# Patient Record
Sex: Male | Born: 1960 | Race: White | Hispanic: No | Marital: Married | Smoking: Former smoker
Health system: Southern US, Community
[De-identification: ages and names within clinical notes are randomized; demographics above are authoritative.]

## PROBLEM LIST (undated history)

## (undated) DIAGNOSIS — F411 Generalized anxiety disorder: Secondary | ICD-10-CM

## (undated) DIAGNOSIS — T7840XA Allergy, unspecified, initial encounter: Secondary | ICD-10-CM

## (undated) DIAGNOSIS — E785 Hyperlipidemia, unspecified: Secondary | ICD-10-CM

## (undated) DIAGNOSIS — J309 Allergic rhinitis, unspecified: Secondary | ICD-10-CM

## (undated) DIAGNOSIS — K219 Gastro-esophageal reflux disease without esophagitis: Secondary | ICD-10-CM

## (undated) DIAGNOSIS — G4730 Sleep apnea, unspecified: Secondary | ICD-10-CM

## (undated) DIAGNOSIS — F329 Major depressive disorder, single episode, unspecified: Secondary | ICD-10-CM

## (undated) HISTORY — DX: Gastro-esophageal reflux disease without esophagitis: K21.9

## (undated) HISTORY — PX: SHOULDER SURGERY: SHX246

## (undated) HISTORY — DX: Major depressive disorder, single episode, unspecified: F32.9

## (undated) HISTORY — DX: Hyperlipidemia, unspecified: E78.5

## (undated) HISTORY — DX: Sleep apnea, unspecified: G47.30

## (undated) HISTORY — DX: Generalized anxiety disorder: F41.1

## (undated) HISTORY — DX: Allergy, unspecified, initial encounter: T78.40XA

## (undated) HISTORY — DX: Allergic rhinitis, unspecified: J30.9

---

## 1986-12-20 HISTORY — PX: ORIF ELBOW FRACTURE: SHX5031

## 1997-12-20 HISTORY — PX: UPPER GASTROINTESTINAL ENDOSCOPY: SHX188

## 2004-11-06 ENCOUNTER — Emergency Department (HOSPITAL_COMMUNITY): Admission: EM | Admit: 2004-11-06 | Discharge: 2004-11-06 | Payer: Self-pay | Admitting: Family Medicine

## 2007-05-10 ENCOUNTER — Emergency Department (HOSPITAL_COMMUNITY): Admission: EM | Admit: 2007-05-10 | Discharge: 2007-05-10 | Payer: Self-pay | Admitting: Emergency Medicine

## 2009-05-24 ENCOUNTER — Emergency Department (HOSPITAL_COMMUNITY): Admission: EM | Admit: 2009-05-24 | Discharge: 2009-05-24 | Payer: Self-pay | Admitting: Emergency Medicine

## 2009-06-03 ENCOUNTER — Encounter: Payer: Self-pay | Admitting: Family Medicine

## 2009-06-04 ENCOUNTER — Ambulatory Visit: Payer: Self-pay | Admitting: Family Medicine

## 2009-06-04 DIAGNOSIS — F411 Generalized anxiety disorder: Secondary | ICD-10-CM

## 2009-06-04 DIAGNOSIS — R030 Elevated blood-pressure reading, without diagnosis of hypertension: Secondary | ICD-10-CM | POA: Insufficient documentation

## 2009-06-04 DIAGNOSIS — J309 Allergic rhinitis, unspecified: Secondary | ICD-10-CM | POA: Insufficient documentation

## 2009-06-04 DIAGNOSIS — F329 Major depressive disorder, single episode, unspecified: Secondary | ICD-10-CM

## 2009-06-04 DIAGNOSIS — K219 Gastro-esophageal reflux disease without esophagitis: Secondary | ICD-10-CM

## 2009-06-04 DIAGNOSIS — F3289 Other specified depressive episodes: Secondary | ICD-10-CM

## 2009-06-04 DIAGNOSIS — R42 Dizziness and giddiness: Secondary | ICD-10-CM | POA: Insufficient documentation

## 2009-06-04 DIAGNOSIS — A630 Anogenital (venereal) warts: Secondary | ICD-10-CM | POA: Insufficient documentation

## 2009-06-04 HISTORY — DX: Allergic rhinitis, unspecified: J30.9

## 2009-06-04 HISTORY — DX: Generalized anxiety disorder: F41.1

## 2009-06-04 HISTORY — DX: Other specified depressive episodes: F32.89

## 2009-06-04 HISTORY — DX: Gastro-esophageal reflux disease without esophagitis: K21.9

## 2009-06-04 HISTORY — DX: Major depressive disorder, single episode, unspecified: F32.9

## 2009-07-01 ENCOUNTER — Ambulatory Visit: Payer: Self-pay | Admitting: Family Medicine

## 2009-07-01 DIAGNOSIS — R5383 Other fatigue: Secondary | ICD-10-CM

## 2009-07-01 DIAGNOSIS — R5381 Other malaise: Secondary | ICD-10-CM | POA: Insufficient documentation

## 2009-07-01 LAB — CONVERTED CEMR LAB
ALT: 28 units/L (ref 0–53)
AST: 23 units/L (ref 0–37)
Albumin: 4.7 g/dL (ref 3.5–5.2)
Alkaline Phosphatase: 37 units/L — ABNORMAL LOW (ref 39–117)
BUN: 13 mg/dL (ref 6–23)
Basophils Absolute: 0 10*3/uL (ref 0.0–0.1)
Basophils Relative: 0.2 % (ref 0.0–3.0)
Bilirubin, Direct: 0.2 mg/dL (ref 0.0–0.3)
CO2: 29 meq/L (ref 19–32)
Calcium: 9.4 mg/dL (ref 8.4–10.5)
Chloride: 105 meq/L (ref 96–112)
Cholesterol: 252 mg/dL — ABNORMAL HIGH (ref 0–200)
Creatinine, Ser: 0.9 mg/dL (ref 0.4–1.5)
Direct LDL: 189.3 mg/dL
Eosinophils Absolute: 0.1 10*3/uL (ref 0.0–0.7)
Eosinophils Relative: 1.1 % (ref 0.0–5.0)
GFR calc non Af Amer: 96.03 mL/min (ref >60)
Glucose, Bld: 103 mg/dL — ABNORMAL HIGH (ref 70–99)
HCT: 43.8 % (ref 39.0–52.0)
HDL: 46.7 mg/dL (ref >39.00)
Hemoglobin: 15 g/dL (ref 13.0–17.0)
Lymphocytes Relative: 31.5 % (ref 12.0–46.0)
Lymphs Abs: 2.1 10*3/uL (ref 0.7–4.0)
MCHC: 34.2 g/dL (ref 30.0–36.0)
MCV: 91.2 fL (ref 78.0–100.0)
Monocytes Absolute: 0.4 10*3/uL (ref 0.1–1.0)
Monocytes Relative: 5.6 % (ref 3.0–12.0)
Neutro Abs: 4.2 10*3/uL (ref 1.4–7.7)
Neutrophils Relative %: 61.6 % (ref 43.0–77.0)
PSA: 0.69 ng/mL (ref 0.10–4.00)
Platelets: 223 10*3/uL (ref 150.0–400.0)
Potassium: 4.5 meq/L (ref 3.5–5.1)
RBC: 4.8 M/uL (ref 4.22–5.81)
RDW: 11.7 % (ref 11.5–14.6)
Sodium: 140 meq/L (ref 135–145)
TSH: 1.49 microintl units/mL (ref 0.35–5.50)
Total Bilirubin: 0.9 mg/dL (ref 0.3–1.2)
Total CHOL/HDL Ratio: 5
Total Protein: 7.6 g/dL (ref 6.0–8.3)
Triglycerides: 120 mg/dL (ref 0.0–149.0)
VLDL: 24 mg/dL (ref 0.0–40.0)
WBC: 6.8 10*3/uL (ref 4.5–10.5)

## 2009-07-02 ENCOUNTER — Telehealth: Payer: Self-pay | Admitting: Family Medicine

## 2009-07-07 ENCOUNTER — Ambulatory Visit: Payer: Self-pay | Admitting: Family Medicine

## 2009-07-07 DIAGNOSIS — E785 Hyperlipidemia, unspecified: Secondary | ICD-10-CM

## 2009-07-07 HISTORY — DX: Hyperlipidemia, unspecified: E78.5

## 2009-07-10 LAB — CONVERTED CEMR LAB
HCV Ab: NEGATIVE
Hep A IgM: NEGATIVE
Hep B C IgM: NEGATIVE
Hepatitis B Surface Ag: NEGATIVE

## 2009-07-15 ENCOUNTER — Ambulatory Visit: Payer: Self-pay | Admitting: Licensed Clinical Social Worker

## 2009-07-23 ENCOUNTER — Ambulatory Visit: Payer: Self-pay | Admitting: Licensed Clinical Social Worker

## 2009-08-07 ENCOUNTER — Ambulatory Visit: Payer: Self-pay | Admitting: Family Medicine

## 2009-08-07 DIAGNOSIS — M67919 Unspecified disorder of synovium and tendon, unspecified shoulder: Secondary | ICD-10-CM | POA: Insufficient documentation

## 2009-08-07 DIAGNOSIS — M7520 Bicipital tendinitis, unspecified shoulder: Secondary | ICD-10-CM | POA: Insufficient documentation

## 2009-08-07 DIAGNOSIS — M719 Bursopathy, unspecified: Secondary | ICD-10-CM

## 2009-08-07 DIAGNOSIS — M25519 Pain in unspecified shoulder: Secondary | ICD-10-CM | POA: Insufficient documentation

## 2009-08-08 ENCOUNTER — Ambulatory Visit: Payer: Self-pay | Admitting: Licensed Clinical Social Worker

## 2009-08-22 ENCOUNTER — Ambulatory Visit: Payer: Self-pay | Admitting: Licensed Clinical Social Worker

## 2009-09-05 ENCOUNTER — Ambulatory Visit: Payer: Self-pay | Admitting: Licensed Clinical Social Worker

## 2009-09-16 ENCOUNTER — Ambulatory Visit: Payer: Self-pay | Admitting: Family Medicine

## 2009-09-23 ENCOUNTER — Ambulatory Visit: Payer: Self-pay | Admitting: Licensed Clinical Social Worker

## 2009-11-03 ENCOUNTER — Ambulatory Visit: Payer: Self-pay | Admitting: Family Medicine

## 2009-11-03 LAB — CONVERTED CEMR LAB
ALT: 25 units/L (ref 0–53)
AST: 18 units/L (ref 0–37)
Albumin: 4.8 g/dL (ref 3.5–5.2)
Alkaline Phosphatase: 36 units/L — ABNORMAL LOW (ref 39–117)
Bilirubin, Direct: 0.1 mg/dL (ref 0.0–0.3)
Cholesterol: 164 mg/dL (ref 0–200)
HDL: 40.8 mg/dL (ref >39.00)
LDL Cholesterol: 105 mg/dL — ABNORMAL HIGH (ref 0–99)
Total Bilirubin: 0.8 mg/dL (ref 0.3–1.2)
Total CHOL/HDL Ratio: 4
Total Protein: 7.5 g/dL (ref 6.0–8.3)
Triglycerides: 90 mg/dL (ref 0.0–149.0)
VLDL: 18 mg/dL (ref 0.0–40.0)

## 2009-11-10 ENCOUNTER — Telehealth: Payer: Self-pay | Admitting: Family Medicine

## 2010-01-14 ENCOUNTER — Telehealth: Payer: Self-pay | Admitting: Family Medicine

## 2010-04-06 ENCOUNTER — Telehealth: Payer: Self-pay | Admitting: Family Medicine

## 2010-06-26 ENCOUNTER — Telehealth: Payer: Self-pay | Admitting: Family Medicine

## 2010-07-06 ENCOUNTER — Ambulatory Visit: Payer: Self-pay | Admitting: Family Medicine

## 2010-07-06 DIAGNOSIS — J330 Polyp of nasal cavity: Secondary | ICD-10-CM | POA: Insufficient documentation

## 2010-07-06 DIAGNOSIS — L989 Disorder of the skin and subcutaneous tissue, unspecified: Secondary | ICD-10-CM | POA: Insufficient documentation

## 2010-07-07 LAB — CONVERTED CEMR LAB
ALT: 32 units/L (ref 0–53)
AST: 21 units/L (ref 0–37)
Albumin: 4.9 g/dL (ref 3.5–5.2)
Alkaline Phosphatase: 43 units/L (ref 39–117)
BUN: 15 mg/dL (ref 6–23)
Basophils Absolute: 0 10*3/uL (ref 0.0–0.1)
Basophils Relative: 0.2 % (ref 0.0–3.0)
Bilirubin, Direct: 0.1 mg/dL (ref 0.0–0.3)
CO2: 32 meq/L (ref 19–32)
Calcium: 9.5 mg/dL (ref 8.4–10.5)
Chloride: 108 meq/L (ref 96–112)
Creatinine, Ser: 1 mg/dL (ref 0.4–1.5)
Eosinophils Absolute: 0.2 10*3/uL (ref 0.0–0.7)
Eosinophils Relative: 2 % (ref 0.0–5.0)
GFR calc non Af Amer: 89.84 mL/min (ref >60)
Glucose, Bld: 91 mg/dL (ref 70–99)
HCT: 42.2 % (ref 39.0–52.0)
Hemoglobin: 14.6 g/dL (ref 13.0–17.0)
Lymphocytes Relative: 33.1 % (ref 12.0–46.0)
Lymphs Abs: 2.6 10*3/uL (ref 0.7–4.0)
MCHC: 34.7 g/dL (ref 30.0–36.0)
MCV: 90.8 fL (ref 78.0–100.0)
Monocytes Absolute: 0.5 10*3/uL (ref 0.1–1.0)
Monocytes Relative: 5.9 % (ref 3.0–12.0)
Neutro Abs: 4.7 10*3/uL (ref 1.4–7.7)
Neutrophils Relative %: 58.8 % (ref 43.0–77.0)
Platelets: 238 10*3/uL (ref 150.0–400.0)
Potassium: 4.9 meq/L (ref 3.5–5.1)
RBC: 4.64 M/uL (ref 4.22–5.81)
RDW: 13 % (ref 11.5–14.6)
Sex Hormone Binding: 15 nmol/L (ref 13–71)
Sodium: 142 meq/L (ref 135–145)
TSH: 1.72 microintl units/mL (ref 0.35–5.50)
Testosterone Free: 48.3 pg/mL (ref 47.0–244.0)
Testosterone-% Free: 2.8 % (ref 1.6–2.9)
Testosterone: 175.5 ng/dL — ABNORMAL LOW (ref 350–890)
Total Bilirubin: 0.5 mg/dL (ref 0.3–1.2)
Total Protein: 7.8 g/dL (ref 6.0–8.3)
WBC: 8 10*3/uL (ref 4.5–10.5)

## 2010-07-15 ENCOUNTER — Ambulatory Visit: Payer: Self-pay | Admitting: Family Medicine

## 2010-07-24 ENCOUNTER — Ambulatory Visit: Payer: Self-pay | Admitting: Otolaryngology

## 2010-07-27 ENCOUNTER — Encounter: Payer: Self-pay | Admitting: Family Medicine

## 2010-07-28 ENCOUNTER — Encounter: Payer: Self-pay | Admitting: Family Medicine

## 2011-01-19 NOTE — Progress Notes (Signed)
Summary: regarding labs  Phone Note Call from Patient   Caller: [phone] Call For: [name] MD Summary of Call: Pt's wife wants your opinion on whether pt can be shown to be bipolar based on labs, she said bipolar can mimic other problems in a persons lab results.  Please advise. Initial call taken by: Lowella Petties CMA,  July 02, 2009 2:12 PM  Follow-up for Phone Call        No.  Absolutely not. Mental health problems - and bipolar illness - are diagnosed clinically. No lab test can diagnose this.  Other than some high cholesterol, his labs are essentially OK. Follow-up by: [name] MD,  July 02, 2009 2:19 PM  Additional Follow-up for Phone Call Additional follow up Details #1::        Advised pt's wife. Additional Follow-up by: Lowella Petties CMA,  July 02, 2009 2:28 PM

## 2011-01-19 NOTE — Progress Notes (Signed)
Summary: impotence  Phone Note Call from Patient Call back at [phone]   Caller: Spouse Call For: [name] MD Summary of Call: Wife calling for patient patient wants to know if the is anything you can change with medications so that he will be able to get more sexually aroused.please advise Initial call taken by: Benny Lennert CMA Duncan Dull),  January 14, 2010 2:03 PM  Follow-up for Phone Call        Not seen since 08/2009  needs face to face office visit to discuss this  Follow-up by: [name] MD,  January 14, 2010 2:10 PM  Additional Follow-up for Phone Call Additional follow up Details #1::        Patients wife advised.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 They could work that out. Patient never knows when he will be home to schedule appt. Additional Follow-up by: Benny Lennert CMA Duncan Dull),  January 14, 2010 2:20 PM    Additional Follow-up for Phone Call Additional follow up Details #2::    This is an exceedingly complex question, and this needs to be addressed to the patient, not his wife. Many things can be causing sexual dysfunction, and a phone call can never fully address these isssues.  I do not think it is medically appropriate to change his psychiatric medications over the phone. I will need to see him and fully evaluate him. This is non-emergent and can be done when he is in town.   Follow-up by: [name] MD,  January 14, 2010 5:30 PM  Additional Follow-up for Phone Call Additional follow up Details #3:: Details for Additional Follow-up Action Taken: patient advised.Consuello Masse CMA  Additional Follow-up by: Benny Lennert CMA Duncan Dull),  January 19, 2010 7:56 AM

## 2011-01-19 NOTE — Progress Notes (Signed)
Summary: refill request for ibuprofen  Phone Note Refill Request Message from:  Fax from Pharmacy  Refills Requested: Medication #1:  IBUPROFEN 800 MG TABS 1 by mouth three times a day.   Last Refilled: 04/03/2010 Faxed request from [facility] [address].  Initial call taken by: Lowella Petties CMA,  June 26, 2010 9:50 AM  Follow-up for Phone Call        Await copland's return...this is a lot of ibuprofen and pt  has GERd history. unsure if Copland wishes him to continue longterm.  Follow-up by: Kerby Nora MD,  June 26, 2010 10:07 AM  Additional Follow-up for Phone Call Additional follow up Details #1::        agree. I would not want this patient to be taking ibuprofen that routinely, particularly in light of prior issues with stomach.  refill denied. chronic NSAIDS not in his best interests.  rare use ok, tylenol ok Additional Follow-up by: [name] MD,  June 26, 2010 1:29 PM    Additional Follow-up for Phone Call Additional follow up Details #2::    Patient advised.Consuello Masse CMA   Follow-up by: Benny Lennert CMA Duncan Dull),  June 26, 2010 2:29 PM

## 2011-01-19 NOTE — Miscellaneous (Signed)
Summary: Physical Therapy Evaluation/[facility]   Physical Therapy Evaluation/[facility]   Imported By: Maryln Gottron 07/31/2010 12:24:34  _____________________________________________________________________  External Attachment:    Type:   Image     Comment:   External Document

## 2011-01-19 NOTE — Assessment & Plan Note (Signed)
Summary: F/U TO DISCUSS SHOULDERS / LFW   Vital Signs:  Patient profile:   50 year old male Height:      71 inches Weight:      232.8 pounds BMI:     32.59 Temp:     98.8 degrees F oral Pulse rate:   76 / minute Pulse rhythm:   regular BP sitting:   122 / 80  (left arm) Cuff size:   regular  Vitals Entered By: Benny Lennert CMA Duncan Dull) (July 15, 2010 8:18 AM)  History of Present Illness: Chief complaint follow up to discuss shoulders  50 year old male:  The patient noted above presents with B, L > R shoulder pain that has been ongoing for 1 year there is no history of trauma or accident. The patient denies neck pain or radicular symptoms. Denies dislocation, subluxation, separation of the shoulder. The patient does complain of pain in the overhead plane.  Medications Tried: NSAIDS Tried PT: No  Prior shoulder Injury: No Prior surgery: No Prior fracture: No  Ache constantly  can still do things but they ache all the time sleepig hurts  left is worse to the right was moving some buckets of sands, left started to   X-ray Musculoskeletal  Procedure date:  07/15/2010  Findings:      DG SHOULDER *R* - [id number]   Clinical Data: 1 year bilateral shoulder pain.   RIGHT SHOULDER - 2+ VIEW   Comparison: [facility] [place] left shoulder radiographs 07/06/2010.   Findings: Slight right AC degenerative joint disease findings seen. No other significant osseous, articular or soft tissue abnormality specifically at right glenohumeral joint noted.   IMPRESSION:   1.  Slight right AC degenerative joint disease. 2.  Otherwise, negative.   Read By:  Barnie Del,  M.D.     Released By:  Barnie Del,  M.D.  X-ray  Procedure date:  07/15/2010  Findings:      [facility]* - [id number]   Clinical Data: Chronic shoulder pain   LEFT SHOULDER - 2+ VIEW   Comparison: None.   Findings: There are mild degenerative changes of the [facility] joint.   No fracture, dislocation, or bony lesions.  Soft tissues unremarkable.   IMPRESSION: Mild degenerative changes of the [facility] joint - no acute findings.   Read By:  Bernerd Limbo,  M.D.     Released By:  Bernerd Limbo,  M.D.  Allergies (verified): No Known Drug Allergies  Past History:  Past medical, surgical, family and social histories (including risk factors) reviewed, and no changes noted (except as noted below).  Past Medical History: Reviewed history from 07/07/2009 and no changes required. ELEVATED BP VENEREAL WARTS GERD  DEPRESSION  Anxiety R Elbow Fx. Allergic rhinitis Sleep Apnea, on CPAP History of esophageal stricture status post EGD and dilation Hyperlipidemia  Past Surgical History: Reviewed history from 06/04/2009 and no changes required. R Elbow, ORIF, distant  EGD, approximately 2000, dilation for stricture  Family History: Reviewed history from 06/04/2009 and no changes required. Family History Diabetes 1st degree relative Family History of Prostate CA 1st degree relative <50  o/w n/c  Social History: Reviewed history from 06/04/2009 and no changes required. Occupation:construction Married Former smoker Alcohol use-yes, rare Drug use-no Regular exercise-no  Review of Systems       REVIEW OF SYSTEMS  GEN: No systemic complaints, no fevers, chills, sweats, or other acute illnesses MSK: Detailed in the HPI GI: tolerating PO intake  without difficulty Neuro: No numbness, parasthesias, or tingling associated. Otherwise the pertinent positives of the ROS are noted above.    Physical Exam  General:  GEN: Well-developed,well-nourished,in no acute distress; alert,appropriate and cooperative throughout examination HEENT: Normocephalic and atraumatic without obvious abnormalities. No apparent alopecia or balding. Ears, externally no deformities PULM: Breathing comfortably in no respiratory distress EXT: No clubbing, cyanosis, or edema PSYCH:  Normally interactive. Cooperative during the interview. Pleasant. Friendly and conversant. Not anxious or depressed appearing. Normal, full affect.  Msk:  Shoulder: Inspection: No muscle wasting or winging Ecchymosis/edema: neg  AC joint, scapula, clavicle: NT Cervical spine: NT, full ROM Spurling's: neg Abduction: full, 5/5 Flexion: full, 5/5 IR, full, lift-off: 5/5 ER at neutral: full, 5/5 AC crossover: neg Neer: pos Hawkins: pos Drop Test: neg Empty Can: pos Supraspinatus insertion: mild-mod T Bicipital groove: NT Speed's: neg Yergason's: neg Sulcus sign: neg Scapular dyskinesis: none C5-T1 intact  Neuro: Sensation intact Grip 5/5    Impression & Recommendations:  Problem # 1:  ROTATOR CUFF SYNDROME (ICD-726.10) Shoulder anatomy was reviewed with the patient using and anatomical model.   Rotator cuff strengthening and scapular stabilization exercises were reviewed with the patient.  A handout was given based on a shoulder program from Dr. Graciella Freer of [facility] and the [facility].  Retraining shoulder mechanics and function was emphasized to the patient with rehab done at least 5-6 days a week.  The patient could benefit from formal PT to assist with scapular stabilization and RTC strengthening, referral made.  SubAC Injection, L Verbal consent was obtained from the patient. Risks, benefits, and alternatives were explained. Patient prepped with Betadine and Ethyl Chloride used for anesthesia. The subacromial space was injected using the posterior approach. The patient tolerated the procedure well and had decreased pain post injection. No complications. Injection: 9 cc of Lidocaine 1% and 1cc of Kenalog 40 mg. Needle: 22 gauge   Orders: Physical Therapy Referral (PT) Joint Aspirate / Injection, Large (20610) Kenalog 10mg  (4units) (J3301)  Problem # 2:  SHOULDER PAIN, BILATERAL (ICD-719.41) AC arthropathy, B  Orders: Physical Therapy Referral (PT) Joint  Aspirate / Injection, Large (13244) Kenalog 10mg  (4units) (J3301)  Complete Medication List: 1)  Nasonex 50 Mcg/act Susp (Mometasone furoate) .... 2 sprays each nostril two times a day 2)  Epipen 2-pak 0.3 Mg/0.3ml (1:1000) Devi (Epinephrine hcl (anaphylaxis)) 3)  Simvastatin 40 Mg Tabs (Simvastatin) .... Take one tablet at bedtime 4)  Citalopram Hydrobromide 40 Mg Tabs (Citalopram hydrobromide) .... Take 1 1/2  tablet by mouth daily 5)  Omeprazole 20 Mg Cpdr (Omeprazole) .... One by mouth daily 6)  Levocetirizine Dihydrochloride 5 Mg Tabs (Levocetirizine dihydrochloride) .Marland Kitchen.. 1 by mouth daily 7)  Singulair 10 Mg Tabs (Montelukast sodium) .Marland Kitchen.. 1 by mouth daily (failure flonase, allegra, zyrtec)  Patient Instructions: 1)  Referral Appointment Information 2)  Day/Date: 3)  Time: 4)  Place/MD: 5)  Address: 6)  Phone/Fax: 7)  Patient given appointment information. Information/Orders faxed/mailed.  8)  f/u 6 weeks Prescriptions: CITALOPRAM HYDROBROMIDE 40 MG TABS (CITALOPRAM HYDROBROMIDE) take 1 1/2  tablet by mouth daily  #135 x 4   Entered and Authorized by:   [name] MD   Signed by:   [name] MD on 07/15/2010   Method used:   [facility]        [facility]  [address] [id number]       [address]       [place], [place]  [address]  Ph: [phone] or [phone]       Fax: [phone]   RxID:   [id number]   Current Allergies (reviewed today): No known allergies

## 2011-01-19 NOTE — Assessment & Plan Note (Signed)
Summary: FOLLOW-UP ON MEDS AND MOLE ON LEFT SHOULDER/JRR   Vital Signs:  Patient profile:   50 year old male Weight:      231.50 pounds Temp:     98.5 degrees F oral Pulse rate:   64 / minute Pulse rhythm:   regular BP sitting:   128 / 86  (left arm) Cuff size:   large  Vitals Entered By: Janee Morn CMA (July 06, 2010 9:55 AM) CC: F/U meds   History of Present Illness: 50 year old male:  Has a mole on his left arm. is been changing over time, and he is concerned about it. He doesn't want very discolored, and another area is approximate 4 cm away this fleshy and coloration.  Still having some sinus problems - used some flonase at night. zyrtec - feels no better. has not tried claritin or Careers adviser.   Nothing has worked. very frustrated with this.  Still having some shoulder pain and issues - no different. bilaterally nature.  Also d/c his remeron, nothing this was improving or helping.  sex drive is low still always tired - he is able to keep and maintain am erection. He is only having problems with desire.  Sleeps with a CPAP -- has had it for a couple of years.  Nasal mask for CPAP. no recent titration  Nasal polyp - ENT (nose, allergies)    Allergies (verified): No Known Drug Allergies  Past History:  Past medical, surgical, family and social histories (including risk factors) reviewed, and no changes noted (except as noted below).  Past Medical History: Reviewed history from 07/07/2009 and no changes required. ELEVATED BP VENEREAL WARTS GERD  DEPRESSION  Anxiety R Elbow Fx. Allergic rhinitis Sleep Apnea, on CPAP History of esophageal stricture status post EGD and dilation Hyperlipidemia  Past Surgical History: Reviewed history from 06/04/2009 and no changes required. R Elbow, ORIF, distant  EGD, approximately 2000, dilation for stricture  Family History: Reviewed history from 06/04/2009 and no changes required. Family History Diabetes 1st degree  relative Family History of Prostate CA 1st degree relative <50  o/w n/c  Social History: Reviewed history from 06/04/2009 and no changes required. Occupation:construction Married Former smoker Alcohol use-yes, rare Drug use-no Regular exercise-no  Review of Systems       as above. no fever, chills, sweats.  Physical Exam  General:  Well-developed,well-nourished,in no acute distress; alert,appropriate and cooperative throughout examination Head:  Normocephalic and atraumatic without obvious abnormalities. No apparent alopecia or balding. Ears:  External ear exam shows no significant lesions or deformities.  Otoscopic examination reveals clear canals, tympanic membranes are intact bilaterally without bulging, retraction, inflammation or discharge. Hearing is grossly normal bilaterally. Nose:  the patient appears to have a large nasal polyp on the right side, and also has bilaterally diffuse swollen boggy turbinates. Also has some dried blood in the left nares internally. Mouth:  Oral mucosa and oropharynx without lesions or exudates.  Teeth in good repair. Neck:  No deformities, masses, or tenderness noted. Lungs:  Normal respiratory effort, chest expands symmetrically. Lungs are clear to auscultation, no crackles or wheezes. Heart:  Normal rate and regular rhythm. S1 and S2 normal without gallop, murmur, click, rub or other extra sounds. Neurologic:  alert & oriented X3 and gait normal.   Skin:  left lower neck and shoulder area, elevated area, one is elevated and darkish in appearance, and the other is elevated pearly fleshy coloration. Psych:  Cognition and judgment appear intact. Alert and cooperative  with normal attention span and concentration. No apparent delusions, illusions, hallucinations   Impression & Recommendations:  Problem # 1:  ALLERGIC RHINITIS (ICD-477.9) Assessment Deteriorated decompensated, chronic allergic rhinitis. Failure multiple therapies. And a changes  allergy regimen to include Xyzal and Nasonex. I'm also going add some Singulair.  The following medications were removed from the medication list:    Zyrtec Allergy 10 Mg Tabs (Cetirizine hcl) His updated medication list for this problem includes:    Nasonex 50 Mcg/act Susp (Mometasone furoate) .Marland Kitchen... 2 sprays each nostril two times a day    Levocetirizine Dihydrochloride 5 Mg Tabs (Levocetirizine dihydrochloride) .Marland Kitchen... 1 by mouth daily  Orders: ENT Referral (ENT)  Problem # 2:  NASAL POLYP (ICD-471.0) Assessment: New to me, it appears he has a very large basal polyp. I am going to consult ENT, and given his nasal symptoms, nasolaryngoscopy would be reasonable. I appreciate their help.  Orders: ENT Referral (ENT)  Problem # 3:  SKIN LESION (ICD-709.9) Assessment: New concern for potential skin cancer.  Orders: Dermatology Referral (Derma)  Problem # 4:  SHOULDER PAIN, BILATERAL (ICD-719.41) plain shoulder x-rays, and have the patient follow up to discuss in greater detail to  The following medications were removed from the medication list:    Ibuprofen 800 Mg Tabs (Ibuprofen) .Marland Kitchen... 1 by mouth three times a day  Orders: T-Shoulder Right (73030TC) T-Shoulder Left Min 2 Views (73030TC)  Problem # 5:  FATIGUE (ICD-780.79) suspect multifactorial, however no recent CPAP titration. Would initially evaluate for large-scale sinus symptoms and polyps if he is having, and then address CPAP titration  Orders: Venipuncture (16073) T- * Misc. Laboratory test [phone]) Specimen Handling (69485) TLB-BMP (Basic Metabolic Panel-BMET) (80048-METABOL) TLB-CBC Platelet - w/Differential (85025-CBCD) TLB-Hepatic/Liver Function Pnl (80076-HEPATIC) TLB-TSH (Thyroid Stimulating Hormone) (84443-TSH)  Complete Medication List: 1)  Nasonex 50 Mcg/act Susp (Mometasone furoate) .... 2 sprays each nostril two times a day 2)  Epipen 2-pak 0.3 Mg/0.43ml (1:1000) Devi (Epinephrine hcl (anaphylaxis)) 3)   Simvastatin 40 Mg Tabs (Simvastatin) .... Take one tablet at bedtime 4)  Citalopram Hydrobromide 40 Mg Tabs (Citalopram hydrobromide) .... Take 1 1/2  tablet by mouth daily 5)  Omeprazole 20 Mg Cpdr (Omeprazole) .... One by mouth daily 6)  Levocetirizine Dihydrochloride 5 Mg Tabs (Levocetirizine dihydrochloride) .Marland Kitchen.. 1 by mouth daily 7)  Singulair 10 Mg Tabs (Montelukast sodium) .Marland Kitchen.. 1 by mouth daily (failure flonase, allegra, zyrtec)  Patient Instructions: 1)  Referral Appointment Information 2)  Day/Date: 3)  Time: 4)  Place/MD: 5)  Address: 6)  Phone/Fax: 7)  Patient given appointment information. Information/Orders faxed/mailed.  8)  GO TO GET XRAYS 9)  FOLLOW-UP IN THE NEXT FEW WEEKS TO DISCUSS SHOULDERS Prescriptions: SINGULAIR 10 MG TABS (MONTELUKAST SODIUM) 1 by mouth daily (failure flonase, allegra, zyrtec)  #30 x 5   Entered and Authorized by:   [name] MD   Signed by:   [name] MD on 07/06/2010   Method used:   Print then Give to Patient   RxID:   [id number] LEVOCETIRIZINE DIHYDROCHLORIDE 5 MG TABS (LEVOCETIRIZINE DIHYDROCHLORIDE) 1 by mouth daily  #30 x 5   Entered and Authorized by:   [name] MD   Signed by:   [name] MD on 07/06/2010   Method used:   Print then Give to Patient   RxID:   [id number] NASONEX 50 MCG/ACT SUSP (MOMETASONE FUROATE) 2 sprays each nostril two times a day  #1 x 5   Entered and Authorized by:  [name] MD   Signed by:   [name] MD on 07/06/2010   Method used:   Print then Give to Patient   RxID:   [id number]   Current Allergies (reviewed today): No known allergies

## 2011-01-19 NOTE — Consult Note (Signed)
Summary: [facility]  [facility]   Imported By: Lanelle Bal 08/06/2010 13:26:04  _____________________________________________________________________  External Attachment:    Type:   Image     Comment:   External Document

## 2011-01-19 NOTE — Progress Notes (Signed)
Summary: citalopram  Phone Note Refill Request Message from:  Patient on November 10, 2009 12:09 PM  Refills Requested: Medication #1:  CITALOPRAM HYDROBROMIDE 40 MG TABS take 1 1/2  tablet by mouth daily   Supply Requested: [phone]   Method Requested: Electronic Initial call taken by: Benny Lennert CMA Duncan Dull),  November 10, 2009 12:10 PM  Follow-up for Phone Call        OK to call in 1 month he was given 3 month supply last time  #30 only, 0 refills Follow-up by: [name] MD,  November 10, 2009 1:50 PM  Additional Follow-up for Phone Call Additional follow up Details #1::        rx called to pharmacy Additional Follow-up by: Benny Lennert CMA (AAMA),  November 10, 2009 2:23 PM    Prescriptions: CITALOPRAM HYDROBROMIDE 40 MG TABS (CITALOPRAM HYDROBROMIDE) take 1 1/2  tablet by mouth daily  #30 x 0   Entered and Authorized by:   [name] MD   Signed by:   [name] MD on 11/10/2009   Method used:   Telephoned to ...       [facility]  [address] [facility]ail[facility]       [address]       [place], [place]  [address]       Ph: [phone] or [phone]       Fax: [phone]   RxID:   [id number] CITALOPRAM HYDROBROMIDE 40 MG TABS (CITALOPRAM HYDROBROMIDE) take 1 1/2  tablet by mouth daily  #135 x 1   Entered and Authorized by:   [name] MD   Signed by:   [name] MD on 11/10/2009   Method used:   Telephoned to ...       [facility]  [address]       [address]       [place], [place]  [address]       Ph: [phone] or [phone]       Fax: [phone]   RxID:   [id number]

## 2011-01-19 NOTE — Progress Notes (Signed)
Summary: Citalopram  Phone Note Refill Request Message from:  Fax from Pharmacy on April 06, 2010 9:38 AM  Refills Requested: Medication #1:  CITALOPRAM HYDROBROMIDE 40 MG TABS take 1 1/2  tablet by mouth daily It looks like this has only been filled for 3 month intervals.  Shall I refill for 1 year or do you prefer 3 months at a time?   [facility]  Phone:   [phone]   Method Requested: Electronic Initial call taken by: Delilah Shan CMA Duncan Dull),  April 06, 2010 9:40 AM  Follow-up for Phone Call        ok to fill 1 year  thanks Follow-up by: [name] MD,  April 06, 2010 9:50 AM

## 2011-01-19 NOTE — Assessment & Plan Note (Signed)
Summary: 1 month fu   Vital Signs:  Patient profile:   50 year old male Weight:      215 pounds Temp:     98.5 degrees F oral Pulse rate:   60 / minute Pulse rhythm:   regular BP sitting:   110 / 70  (left arm) Cuff size:   large  Vitals Entered By: Mervin Hack CMA (August 07, 2009 8:08 AM)  History of Present Illness: Chief Complaint: 1 month follow-up  Anxiety follow-up: his windows and counseling, and he has found this helpful, he is significantly improved on his increased dose of Celexa, now taking 40 mg daily.  Jeanett Schlein.   BM, increased. Symptoms are  Still with no ambition, not want to do things. Anhedonia Sleeping 10-5:30, on and off some -- but pretty good The suicidal ideation or homicidal ideation.  Shoulder pain: no trauma or injury. Picking up fifty pound buckets of sand.    for about 6 months, patient has been having bilateral shoulder pain, he describes this as deep in the shoulder, it does wake him up occasionally at night He has not had any traumatic injury, fracture, operative intervention previously. Does have pain with certain motions and movements. He does not particularly complain about overhead motion. He is not able to specifically point to one area. No prior history of dislocation. Generally, these do take every day. He has not tried any intervention at this point.  bicipital tendonitis mild AC mild RTC ? SLAP   40 mins   Allergies: No Known Drug Allergies  Past History:  Past medical, surgical, family and social histories (including risk factors) reviewed, and no changes noted (except as noted below).  Past Medical History: Reviewed history from 07/07/2009 and no changes required. ELEVATED BP VENEREAL WARTS GERD  DEPRESSION  Anxiety R Elbow Fx. Allergic rhinitis Sleep Apnea, on CPAP History of esophageal stricture status post EGD and dilation Hyperlipidemia  Past Surgical History: Reviewed history from 06/04/2009 and no  changes required. R Elbow, ORIF, distant  EGD, approximately 2000, dilation for stricture  Family History: Reviewed history from 06/04/2009 and no changes required. Family History Diabetes 1st degree relative Family History of Prostate CA 1st degree relative <50  o/w n/c  Social History: Reviewed history from 06/04/2009 and no changes required. Occupation:construction Married Former smoker Alcohol use-yes, rare Drug use-no Regular exercise-no  Review of Systems       anxiety and depression symptoms as described above No fever, chills, still with fatigue Shoulder pain, no neck pain, no radiculopathy.  Physical Exam  General:  Well-developed,well-nourished,in no acute distress; alert,appropriate and cooperative throughout examination Head:  Normocephalic and atraumatic without obvious abnormalities. No apparent alopecia or balding. Eyes:  vision grossly intact.   Ears:  no external deformities.   Nose:  no external deformity.   Msk:  Shoulder: bilateral Inspection: No muscle wasting or winging Ecchymosis/edema: neg  AC joint, scapula, clavicle: NT Cervical spine: NT, full ROM Spurling's: neg Abduction: full, 5/5, mild pain, minimal arcus motion pain. Flexion: full, 5/5 IR, full, lift-off: 5/5, pain with liftoff ER at neutral: full, 5/5 AC crossover: mild pain, bilaterally, more with crossover compression test Neer: pos, mildly bilaterally Hawkins: mildly positive bilaterally Drop Test: neg Empty Can: negative Supraspinatus insertion: nontender Bicipital groove: NT Speed's: positive Yergason's: positive Sulcus sign: neg C5-T1 intact  Neuro: Sensation intact Grip 5/5    Detailed Back/Spine Exam  Cervical Exam:  Inspection-deformity:    Normal Palpation-spinal tenderness:  Normal Range of  Motion:    Forward Flexion:   60 degrees    Hyperextension:   75 degrees    Right Lat. Flexion:   45 degrees    Left Lat. Flexion:   45 degrees    Right Lat.  Rotation:   80 degrees    Left Lat. Rotation:   80 degrees Spurling Maneuver:    negative   Impression & Recommendations:  Problem # 1:  ANXIETY (ICD-300.00) Assessment Improved >45 minutes spent in total face to face time with the patient with >50% of time spent in counselling and coordination of care: extensive face-to-face counseling, greater than 25 minutes spent in counseling about anxiety and depression alone, generally the patient is improving. He is in counseling, does feel better after his increase on medication. Additionally, after this, the patient brought up his bilateral shoulder pain, which required a full evaluation. In addition to examination, minimally, 5-10 minutes were used an explanation of anatomy and rehabilitation.  Plan: Continue with psychotherapy, increase Celexa dosing to 60 mg daily  His updated medication list for this problem includes:    Citalopram Hydrobromide 40 Mg Tabs (Citalopram hydrobromide) .Marland Kitchen... Take one tablet by mouth daily    Citalopram Hydrobromide 20 Mg Tabs (Citalopram hydrobromide) .Marland Kitchen... Take one tablet by mouth daily  Problem # 2:  DEPRESSION (ICD-311) Assessment: Improved  His updated medication list for this problem includes:    Citalopram Hydrobromide 40 Mg Tabs (Citalopram hydrobromide) .Marland Kitchen... Take one tablet by mouth daily    Citalopram Hydrobromide 20 Mg Tabs (Citalopram hydrobromide) .Marland Kitchen... Take one tablet by mouth daily  Problem # 3:  SHOULDER PAIN, BILATERAL (ICD-719.41) Assessment: New  multiple pathologies: Bicipital tendinitis bilaterally, positive speed and Yergason's test. Cannot rule out SLAP lesion. Additionally, the patient has mild rotator cuff impingement signs, and some degree of a.c. joint inflammation versus arthropathy.  Multipart shoulder pain. With all of these, think he would likely benefit from scapular stabilization and rotator cuff strengthening. I recommended formal physical therapy, however the patient declined  my recommendation.  At this point he is going to do a home exercise program for rotator cuff strengthening and scapular stabilization from [employer]. He is following up in one month's time, if he does poorly at that point, I do think again that formal therapy is appropriate.  While he does have multiple points of pathology, I do not think he is painful enough to require a injection at this point.  Orders: Theraband per yard (A9300)  Problem # 4:  ROTATOR CUFF SYNDROME (ICD-726.10) Assessment: New  Orders: Theraband per yard [phone])  Problem # 5:  BICEPS TENDINITIS, BILATERAL (ICD-726.12) Assessment: New  Orders: Theraband per yard (A9300)  Complete Medication List: 1)  Fluticasone Propionate 50 Mcg/act Susp (Fluticasone propionate) .... 2 sprays each nostril once daily 2)  Epipen 2-pak 0.3 Mg/0.68ml (1:1000) Devi (Epinephrine hcl (anaphylaxis)) 3)  Zyrtec Allergy 10 Mg Tabs (Cetirizine hcl) 4)  Simvastatin 40 Mg Tabs (Simvastatin) .... Take one tablet at bedtime 5)  Citalopram Hydrobromide 40 Mg Tabs (Citalopram hydrobromide) .... Take one tablet by mouth daily 6)  Omeprazole 20 Mg Cpdr (Omeprazole) .... One by mouth daily 7)  Citalopram Hydrobromide 20 Mg Tabs (Citalopram hydrobromide) .... Take one tablet by mouth daily  Patient Instructions: 1)  FASTING 2)  2-3 months 3)  FLP, Liver function panel: 272.4 4)  f/u 4-6 Prescriptions: CITALOPRAM HYDROBROMIDE 20 MG TABS (CITALOPRAM HYDROBROMIDE) take one tablet by mouth daily  #30 x 5   Entered and Authorized  by:   [name] MD   Signed by:   [name] MD on 08/07/2009   Method used:   [facility]        [facility] [facility]retail)       [address]       [place], [place]  [address]       Ph: [phone] or [phone]       Fax: [phone]   RxID:   [id number]   Current Allergies (reviewed today): No known allergies

## 2011-01-27 ENCOUNTER — Ambulatory Visit (INDEPENDENT_AMBULATORY_CARE_PROVIDER_SITE_OTHER): Payer: 59 | Admitting: Family Medicine

## 2011-01-27 ENCOUNTER — Encounter: Payer: Self-pay | Admitting: Family Medicine

## 2011-01-27 ENCOUNTER — Other Ambulatory Visit: Payer: Self-pay | Admitting: Family Medicine

## 2011-01-27 DIAGNOSIS — E785 Hyperlipidemia, unspecified: Secondary | ICD-10-CM

## 2011-01-27 DIAGNOSIS — Z79899 Other long term (current) drug therapy: Secondary | ICD-10-CM

## 2011-01-27 DIAGNOSIS — Z125 Encounter for screening for malignant neoplasm of prostate: Secondary | ICD-10-CM

## 2011-01-27 DIAGNOSIS — M25519 Pain in unspecified shoulder: Secondary | ICD-10-CM

## 2011-01-27 DIAGNOSIS — F411 Generalized anxiety disorder: Secondary | ICD-10-CM

## 2011-01-27 DIAGNOSIS — F329 Major depressive disorder, single episode, unspecified: Secondary | ICD-10-CM

## 2011-01-27 LAB — CBC WITH DIFFERENTIAL/PLATELET
Basophils Absolute: 0 10*3/uL (ref 0.0–0.1)
Basophils Relative: 0.4 % (ref 0.0–3.0)
Eosinophils Absolute: 0.1 10*3/uL (ref 0.0–0.7)
Eosinophils Relative: 1.7 % (ref 0.0–5.0)
HCT: 42.7 % (ref 39.0–52.0)
Hemoglobin: 14.6 g/dL (ref 13.0–17.0)
Lymphocytes Relative: 31.1 % (ref 12.0–46.0)
Lymphs Abs: 2.3 10*3/uL (ref 0.7–4.0)
MCHC: 34.3 g/dL (ref 30.0–36.0)
MCV: 91.2 fl (ref 78.0–100.0)
Monocytes Absolute: 0.5 10*3/uL (ref 0.1–1.0)
Monocytes Relative: 6.6 % (ref 3.0–12.0)
Neutro Abs: 4.4 10*3/uL (ref 1.4–7.7)
Neutrophils Relative %: 60.2 % (ref 43.0–77.0)
Platelets: 220 10*3/uL (ref 150.0–400.0)
RBC: 4.68 Mil/uL (ref 4.22–5.81)
RDW: 12.7 % (ref 11.5–14.6)
WBC: 7.3 10*3/uL (ref 4.5–10.5)

## 2011-01-27 LAB — LIPID PANEL
Cholesterol: 165 mg/dL (ref 0–200)
HDL: 43.4 mg/dL (ref >39.00)
LDL Cholesterol: 99 mg/dL (ref 0–99)
Total CHOL/HDL Ratio: 4
Triglycerides: 114 mg/dL (ref 0.0–149.0)
VLDL: 22.8 mg/dL (ref 0.0–40.0)

## 2011-01-27 LAB — HEPATIC FUNCTION PANEL
ALT: 41 U/L (ref 0–53)
AST: 30 U/L (ref 0–37)
Albumin: 4.7 g/dL (ref 3.5–5.2)
Alkaline Phosphatase: 46 U/L (ref 39–117)
Bilirubin, Direct: 0.1 mg/dL (ref 0.0–0.3)
Total Bilirubin: 0.6 mg/dL (ref 0.3–1.2)
Total Protein: 7.4 g/dL (ref 6.0–8.3)

## 2011-01-27 LAB — BASIC METABOLIC PANEL
BUN: 14 mg/dL (ref 6–23)
CO2: 31 mEq/L (ref 19–32)
Calcium: 9.3 mg/dL (ref 8.4–10.5)
Chloride: 102 mEq/L (ref 96–112)
Creatinine, Ser: 0.9 mg/dL (ref 0.4–1.5)
GFR: 90.73 mL/min (ref >60.00)
Glucose, Bld: 91 mg/dL (ref 70–99)
Potassium: 4.8 mEq/L (ref 3.5–5.1)
Sodium: 138 mEq/L (ref 135–145)

## 2011-01-27 LAB — PSA: PSA: 0.48 ng/mL (ref 0.10–4.00)

## 2011-02-04 NOTE — Assessment & Plan Note (Signed)
Summary: F/U/CLE   [facility]   Vital Signs:  Patient profile:   50 year old male Height:      71 inches Weight:      236.50 pounds BMI:     33.10 Temp:     98.5 degrees F oral Pulse rate:   76 / minute Pulse rhythm:   regular BP sitting:   130 / 80  (left arm) Cuff size:   regular  Vitals Entered By: Benny Lennert CMA Duncan Dull) (January 27, 2011 8:12 AM)  History of Present Illness: Chief complaint follow up   50 year old male:  allergy meds: some intermittent symptoms, taking flonase with good result, better than nasonex.  now taking citalopram, depression stable and doing well at 60 mg  Zocor, cholesterol. tolerating fine.  shoulder pain. taking some ibuprofen.  still with intermittent pain with abduction, has made some adaptations at home, handlebars on motorcycle.   Allergies (verified): No Known Drug Allergies  Past History:  Past medical, surgical, family and social histories (including risk factors) reviewed, and no changes noted (except as noted below).  Past Medical History: Reviewed history from 07/07/2009 and no changes required. ELEVATED BP VENEREAL WARTS GERD  DEPRESSION  Anxiety R Elbow Fx. Allergic rhinitis Sleep Apnea, on CPAP History of esophageal stricture status post EGD and dilation Hyperlipidemia  Past Surgical History: Reviewed history from 06/04/2009 and no changes required. R Elbow, ORIF, distant  EGD, approximately 2000, dilation for stricture  Family History: Reviewed history from 06/04/2009 and no changes required. Family History Diabetes 1st degree relative Family History of Prostate CA 1st degree relative <50  o/w n/c  Social History: Reviewed history from 06/04/2009 and no changes required. Occupation:construction Married Former smoker Alcohol use-yes, rare Drug use-no Regular exercise-no  Review of Systems       REVIEW OF SYSTEMS GEN: No acute illnesses, no fever, chills, sweats. CV: No chest pain or SOB GI: No  noted N or V Otherwise, pertinent positives and negatives are noted in the HPI.   Physical Exam  Additional Exam:  GEN: WDWN, NAD, Non-toxic, A & O x 3 HEENT: Atraumatic, Normocephalic. Neck supple. No masses, No LAD. Ears and Nose: No external deformity. CV: RRR, No M/G/R. No JVD. No thrill. No extra heart sounds. PULM: CTA B, no wheezes, crackles, rhonchi. No retractions. No resp. distress. No accessory muscle use. EXTR: No c/c/e NEURO: Normal gait.  PSYCH: Normally interactive. Conversant. Not depressed or anxious appearing.  Calm demeanor.    Shoulder: Inspection: No muscle wasting or winging Ecchymosis/edema: neg  AC joint, scapula, clavicle: NT Cervical spine: NT, full ROM Spurling's: neg Abduction: full, 5/5 Flexion: full, 5/5 IR, full, lift-off: 5/5 ER at neutral: full, 5/5 AC crossover: neg Neer: pos Hawkins: pos Drop Test: neg Empty Can: pos Supraspinatus insertion: mild-mod T Bicipital groove: NT Speed's: neg Yergason's: neg Sulcus sign: neg Scapular dyskinesis: none C5-T1 intact  Neuro: Sensation intact Grip 5/5    Impression & Recommendations:  Problem # 1:  HYPERLIPIDEMIA (ICD-272.4) Assessment Unchanged  His updated medication list for this problem includes:    Simvastatin 40 Mg Tabs (Simvastatin) .Marland Kitchen... Take one tablet at bedtime  Orders: TLB-Lipid Panel (80061-LIPID)  Labs Reviewed: SGOT: 21 (07/06/2010)   SGPT: 32 (07/06/2010)   HDL:40.80 (11/03/2009), 46.70 (07/01/2009)  LDL:105 (11/03/2009)  Chol:164 (11/03/2009), 252 (07/01/2009)  Trig:90.0 (11/03/2009), 120.0 (07/01/2009)  Problem # 2:  ALLERGIC RHINITIS (ICD-477.9) Assessment: Unchanged  The following medications were removed from the medication list:  Levocetirizine Dihydrochloride 5 Mg Tabs (Levocetirizine dihydrochloride) .Marland Kitchen... 1 by mouth daily His updated medication list for this problem includes:    Fluticasone Propionate 50 Mcg/act Susp (Fluticasone propionate) .Marland Kitchen... 2 sprays  each nostril once daily  Problem # 3:  DEPRESSION (ICD-311) Assessment: Improved  His updated medication list for this problem includes:    Citalopram Hydrobromide 40 Mg Tabs (Citalopram hydrobromide) .Marland Kitchen... Take 1 1/2  tablet by mouth daily  Problem # 4:  ANXIETY (ICD-300.00) Assessment: Improved  His updated medication list for this problem includes:    Citalopram Hydrobromide 40 Mg Tabs (Citalopram hydrobromide) .Marland Kitchen... Take 1 1/2  tablet by mouth daily  Problem # 5:  SHOULDER PAIN, BILATERAL (ICD-719.41) Assessment: Unchanged discussed cont shoulder impingement, s/p inj, PT, chronic impingement. at this point, consideration of arthroscopy not unreasonable, but he would like to be more conservative and change his activities.   Complete Medication List: 1)  Fluticasone Propionate 50 Mcg/act Susp (Fluticasone propionate) .... 2 sprays each nostril once daily 2)  Epipen 2-pak 0.3 Mg/0.46ml (1:1000) Devi (Epinephrine hcl (anaphylaxis)) 3)  Simvastatin 40 Mg Tabs (Simvastatin) .... Take one tablet at bedtime 4)  Citalopram Hydrobromide 40 Mg Tabs (Citalopram hydrobromide) .... Take 1 1/2  tablet by mouth daily 5)  Omeprazole 20 Mg Cpdr (Omeprazole) .... One by mouth daily  Other Orders: Venipuncture (29562) TLB-BMP (Basic Metabolic Panel-BMET) (80048-METABOL) TLB-CBC Platelet - w/Differential (85025-CBCD) TLB-Hepatic/Liver Function Pnl (80076-HEPATIC) TLB-PSA (Prostate Specific Antigen) (84153-PSA)  Patient Instructions: 1)  f/u in fall for CPX Prescriptions: FLUTICASONE PROPIONATE 50 MCG/ACT  SUSP (FLUTICASONE PROPIONATE) 2 sprays each nostril once daily  #3 x 3   Entered and Authorized by:   [name] MD   Signed by:   [name] MD on 01/27/2011   Method used:   [facility]        [facility] ([facility]ail)       [address]       [place], [place]  [address]       Ph: [phone] or [phone]       Fax: [phone]   RxID:    [id number] OMEPRAZOLE 20 MG CPDR (OMEPRAZOLE) one by mouth daily  #90 x 3   Entered and Authorized by:   [name] MD   Signed by:   [name] MD on 01/27/2011   Method used:   [facility]        [id number]       [address]       [place], [place]  [address]       Ph: [phone] or [phone]       Fax: [phone]   RxID:   [id number] CITALOPRAM HYDROBROMIDE 40 MG TABS (CITALOPRAM HYDROBROMIDE) take 1 1/2  tablet by mouth daily  #135 x 4   Entered and Authorized by:   [name] MD   Signed by:   [name] MD on 01/27/2011   Method used:   [facility]        [facility] (retail)       [address]       [place], [place]  [address]       Ph: [phone] or [phone]       Fax: [phone]   RxID:   [id number] SIMVASTATIN 40 MG TABS (SIMVASTATIN) Take one tablet at bedtime  #90 x 3   Entered and Authorized by:   [name] MD   Signed by:  [name] MD on 01/27/2011   Method used:   [facility]        [id number] ([facility]ail)       [address]       [place], [place]  [address]       Ph: [phone] or [phone]       Fax: [phone]   RxID:   [id number] NASONEX 50 MCG/ACT SUSP (MOMETASONE FUROATE) 2 sprays each nostril two times a day  #3 x 3   Entered and Authorized by:   [name] MD   Signed by:   [name] MD on 01/27/2011   Method used:   [facility]        [id number])       [address]       [place], [place]  [address]       Ph: [phone] or [phone]       Fax: [phone]   RxID:   [id number]    Orders Added: 1)  Venipuncture [37628] 2)  TLB-Lipid Panel [80061-LIPID] 3)  TLB-BMP (Basic Metabolic Panel-BMET) [80048-METABOL] 4)  TLB-CBC Platelet - w/Differential [85025-CBCD] 5)  TLB-Hepatic/Liver Function Pnl [80076-HEPATIC] 6)  TLB-PSA (Prostate Specific  Antigen) [84153-PSA] 7)  Est. Patient Level IV [31517]    Current Allergies (reviewed today): No known allergies

## 2011-07-20 ENCOUNTER — Other Ambulatory Visit: Payer: Self-pay | Admitting: Family Medicine

## 2011-07-20 MED ORDER — IBUPROFEN 800 MG PO TABS: 800.0000 mg | ORAL_TABLET | Freq: Three times a day (TID) | ORAL | Status: AC | PRN

## 2011-07-20 NOTE — Telephone Encounter (Signed)
Patient is also requesting a script for Ibuprofen 800 mg, 1-2 tablets daily as needed. Patient's wife states that he has not had this for a while. Patient takes it for his shoulder pain and would a 90 day supply.  [facility]

## 2011-07-20 NOTE — Telephone Encounter (Signed)
TO be filled by PCP.Marland Kitchen Already on his desk top.

## 2011-07-20 NOTE — Telephone Encounter (Signed)
filled

## 2011-08-22 ENCOUNTER — Other Ambulatory Visit: Payer: Self-pay | Admitting: Family Medicine

## 2012-02-16 ENCOUNTER — Other Ambulatory Visit: Payer: Self-pay | Admitting: Family Medicine

## 2012-04-10 ENCOUNTER — Telehealth: Payer: Self-pay

## 2012-04-10 NOTE — Telephone Encounter (Signed)
Patient advised of results.

## 2012-04-10 NOTE — Telephone Encounter (Signed)
Just take a consistent time -- day or night does not matter too much

## 2012-04-10 NOTE — Telephone Encounter (Signed)
Pt is starting new hours at work; hours will be 5 pm until 5 am. Simvastatin instructions are take at bedtime. On work days pt will sleep in AM but 2 days a week when pt is off work he will sleep at night. Should pt take at consistent time of day or take whenever he goes to bed(5 days a week will be in AM and 2 days will be in PM.)Pt can be reached at [phone] or pts wife [phone]. [facility] if pharmacy needed.

## 2012-07-25 ENCOUNTER — Other Ambulatory Visit: Payer: Self-pay | Admitting: Family Medicine

## 2012-08-24 ENCOUNTER — Encounter: Payer: Self-pay | Admitting: Family Medicine

## 2012-08-24 ENCOUNTER — Ambulatory Visit (INDEPENDENT_AMBULATORY_CARE_PROVIDER_SITE_OTHER): Payer: 59 | Admitting: Family Medicine

## 2012-08-24 VITALS — BP 140/86 | HR 86 | Resp 18 | Wt 239.5 lb

## 2012-08-24 DIAGNOSIS — Z1211 Encounter for screening for malignant neoplasm of colon: Secondary | ICD-10-CM

## 2012-08-24 DIAGNOSIS — R5383 Other fatigue: Secondary | ICD-10-CM

## 2012-08-24 DIAGNOSIS — E785 Hyperlipidemia, unspecified: Secondary | ICD-10-CM

## 2012-08-24 DIAGNOSIS — Z79899 Other long term (current) drug therapy: Secondary | ICD-10-CM

## 2012-08-24 DIAGNOSIS — F411 Generalized anxiety disorder: Secondary | ICD-10-CM

## 2012-08-24 DIAGNOSIS — Z13220 Encounter for screening for lipoid disorders: Secondary | ICD-10-CM

## 2012-08-24 DIAGNOSIS — Z0000 Encounter for general adult medical examination without abnormal findings: Secondary | ICD-10-CM

## 2012-08-24 DIAGNOSIS — R5381 Other malaise: Secondary | ICD-10-CM

## 2012-08-24 DIAGNOSIS — Z125 Encounter for screening for malignant neoplasm of prostate: Secondary | ICD-10-CM

## 2012-08-24 DIAGNOSIS — F329 Major depressive disorder, single episode, unspecified: Secondary | ICD-10-CM

## 2012-08-24 MED ORDER — SERTRALINE HCL 50 MG PO TABS: 50.0000 mg | ORAL_TABLET | Freq: Every day | ORAL | Status: DC

## 2012-08-24 NOTE — Progress Notes (Signed)
[facility] at [facility] [address] [place] [address] Phone: [phone] Fax: [phone]  Date:  08/24/2012   Name:  Erik [name]   DOB:  March 08, 1961   MRN:  [id number] Gender: male Age: 51 y.o.  PCP:  [name], MD    Chief Complaint: Follow-up   History of Present Illness:  Erik [name] is a 51 y.o. pleasant patient who presents with the following:  The patient is here for a CPX:  Preventative Health Maintenance Visit:  Health Maintenance Summary Reviewed and updated, unless pt declines services.  Tobacco History Reviewed. Alcohol: drinks beer on the weekend, ~ 12 pack Exercise Habits: some, not enough STD concerns: no risk or activity to increase risk Drug Use: None Encouraged self-testicular check  Health Maintenance  Topic Date Due  . Tetanus/tdap  04/02/1981  . Colonoscopy  04/02/2012  . Influenza Vaccine  09/19/2012   All labs pending  Colonoscopy - in [place]. Feels tired, more irritable. Slept at least nine hours, wakes Drives, feels tired. Has been whole life.  A few months ago, took about 1 1/2 -- felt like he got worse.   Patient Active Problem List  Diagnosis  . VENEREAL WART  . HYPERLIPIDEMIA  . ANXIETY  . DEPRESSION  . NASAL POLYP  . ALLERGIC RHINITIS  . GERD    Past Medical History  Diagnosis Date  . HYPERLIPIDEMIA 07/07/2009    Qualifier: Diagnosis of  By: Patsy Lager MD, Karleen Hampshire    . ANXIETY 06/04/2009    Qualifier: Diagnosis of  By: Patsy Lager MD, Karleen Hampshire    . DEPRESSION 06/04/2009    Qualifier: Diagnosis of  By: Clydene Pugh CMA (AAMA), Heather    . ALLERGIC RHINITIS 06/04/2009    Qualifier: Diagnosis of  By: Patsy Lager MD, Karleen Hampshire    . GERD 06/04/2009    Qualifier: Diagnosis of  By: Clydene Pugh CMA Duncan Dull), Herbert Seta      Past Surgical History  Procedure Date  . Orif elbow fracture     History  Substance Use Topics  . Smoking status: Former Smoker    Types: Cigarettes    Quit date: 08/24/1992  . Smokeless tobacco: Never  Used  . Alcohol Use: Yes    No family history on file.  No Known Allergies  Current Outpatient Prescriptions on File Prior to Visit  Medication Sig Dispense Refill  . citalopram (CELEXA) 40 MG tablet TAKE 1 1/2 TABLET BY MOUTH DAILY  135 tablet  3  . fluticasone (FLONASE) 50 MCG/ACT nasal spray USE 2 SPRAYS IN EACH NOSTRIL ONCE DAILY  48 g  3  . omeprazole (PRILOSEC) 20 MG capsule TAKE 1 CAPSULE BY MOUTH ONCE A DAY  90 capsule  3  . simvastatin (ZOCOR) 40 MG tablet TAKE 1 TABLET BY MOUTH AT BEDTIME  90 tablet  2  . sertraline (ZOLOFT) 50 MG tablet Take 1 tablet (50 mg total) by mouth daily.  30 tablet  3     Medication list has been reviewed and updated.  Current Outpatient Prescriptions on File Prior to Visit  Medication Sig Dispense Refill  . citalopram (CELEXA) 40 MG tablet TAKE 1 1/2 TABLET BY MOUTH DAILY  135 tablet  3  . fluticasone (FLONASE) 50 MCG/ACT nasal spray USE 2 SPRAYS IN EACH NOSTRIL ONCE DAILY  48 g  3  . omeprazole (PRILOSEC) 20 MG capsule TAKE 1 CAPSULE BY MOUTH ONCE A DAY  90 capsule  3  . simvastatin (ZOCOR) 40 MG tablet TAKE 1 TABLET BY  MOUTH AT BEDTIME  90 tablet  2    Review of Systems:   General: Denies fever, chills, sweats. No significant weight loss. FATIGUED ESSENTIALLY ALL THE TIME, BUT FEELS MUCH BETTER WITH CPAP Eyes: Denies blurring,significant itching ENT: Denies earache, sore throat, and hoarseness.  Cardiovascular: Denies chest pains, palpitations, dyspnea on exertion,  Respiratory: Denies cough, dyspnea at rest,wheeezing Breast: no concerns about lumps GI: Denies nausea, vomiting, diarrhea, constipation, change in bowel habits, abdominal pain, melena, hematochezia GU: Denies dysuria, hematuria, urinary hesitancy, nocturia, denies STD risk, no concerns about discharge Musculoskeletal: Denies back pain, joint pain Derm: Denies rash, itching Neuro: Denies  paresthesias, frequent falls, frequent headaches Psych: MORE IRRITABLE AND LASHING OUT  SOME WITH FRIENDS, FAMILY Endocrine: Denies cold intolerance, heat intolerance, polydipsia Heme: Denies enlarged lymph nodes Allergy: No hayfever   Physical Examination: Filed Vitals:   08/24/12 1609  BP: 140/86  Pulse: 86  Resp: 18   Filed Vitals:   08/24/12 1609  Weight: 239 lb 8 oz (108.636 kg)   There is no height on file to calculate BMI. Ideal Body Weight:     Wt Readings from Last 3 Encounters:  08/24/12 239 lb 8 oz (108.636 kg)  01/27/11 236 lb 8 oz (107.276 kg)  07/15/10 232 lb 12.8 oz (105.597 kg)    GEN: well developed, well nourished, no acute distress Eyes: conjunctiva and lids normal, PERRLA, EOMI ENT: TM clear, nares clear, oral exam WNL Neck: supple, no lymphadenopathy, no thyromegaly, no JVD Pulm: clear to auscultation and percussion, respiratory effort normal CV: regular rate and rhythm, S1-S2, no murmur, rub or gallop, no bruits, peripheral pulses normal and symmetric, no cyanosis, clubbing, edema or varicosities Chest: no scars, masses GI: soft, non-tender; no hepatosplenomegaly, masses; active bowel sounds all quadrants GU: no hernia, testicular mass, penile discharge, or prostate enlargement Lymph: no cervical, axillary or inguinal adenopathy MSK: gait normal, muscle tone and strength WNL, no joint swelling, effusions, discoloration, crepitus  SKIN: clear, good turgor, color WNL, no rashes, lesions, or ulcerations Neuro: normal mental status, normal strength, sensation, and motion Psych: alert; oriented to person, place and time, normally interactive and not anxious or depressed in appearance.   Assessment and Plan:  1. Routine general medical examination at a health care facility    2. Screening for lipoid disorders    3. Fatigue  TSH  4. Other and unspecified hyperlipidemia  LDL cholesterol, direct  5. Encounter for long-term (current) use of other medications  Basic metabolic panel, CBC with Differential, Hepatic function panel  6. Special  screening for malignant neoplasm of prostate  PSA  7. Special screening for malignant neoplasm of colon  Ambulatory referral to Gastroenterology  8. ANXIETY    9. DEPRESSION     The patient's preventative maintenance and recommended screening tests for an annual wellness exam were reviewed in full today. Brought up to date unless services declined.  Counselled on the importance of diet, exercise, and its role in overall health and mortality. The patient's FH and SH was reviewed, including their home life, tobacco status, and drug and alcohol status.  Labs as above, colon.  >10 minutes spent in face to face time with patient, >50% spent in counselling or coordination of care: spent in discussion regarding his anxiety, depression. Increased fatigue. Will titrate off of his Celexa and initiate Zoloft. Taper details reviewed extensively. Followup in 5 weeks.  Orders Today:  Orders Placed This Encounter  Procedures  . Basic metabolic panel  . CBC  with Differential  . Hepatic function panel  . PSA  . TSH  . LDL cholesterol, direct  . Ambulatory referral to Gastroenterology    Referral Priority:  Routine    Referral Type:  Consultation    Referral Reason:  Specialty Services Required    Requested Specialty:  Gastroenterology    Number of Visits Requested:  1    Medications Today: (Includes new updates added during medication reconciliation) Meds ordered this encounter  Medications  . sertraline (ZOLOFT) 50 MG tablet    Sig: Take 1 tablet (50 mg total) by mouth daily.    Dispense:  30 tablet    Refill:  3    Medications Discontinued: There are no discontinued medications.   [name], MD

## 2012-08-24 NOTE — Patient Instructions (Addendum)
Drop down to 1 tablet today. 40 mg a day If you start to feel bad, take 1 tablet one day, then alternate with 1 1/2 tablets the next day. (40, then 60)  Do this 40 mg for a week.   Then drop down to 1/2 a tab for a week. If you feel bad, then drop down to 40 mg, then 20 mg (1/2 tab), then 40 mg.  Then stop and start.   Zoloft 50 mg    Recheck in 6 weeks   REFERRAL: GO THE THE FRONT ROOM AT THE ENTRANCE OF OUR CLINIC, NEAR CHECK IN. ASK FOR MARION. SHE WILL HELP YOU SET UP YOUR REFERRAL. DATE: TIME:

## 2012-08-25 ENCOUNTER — Encounter: Payer: Self-pay | Admitting: Internal Medicine

## 2012-08-25 ENCOUNTER — Encounter: Payer: Self-pay | Admitting: Family Medicine

## 2012-08-25 LAB — HEPATIC FUNCTION PANEL
ALT: 33 U/L (ref 0–53)
AST: 26 U/L (ref 0–37)
Albumin: 5.4 g/dL — ABNORMAL HIGH (ref 3.5–5.2)
Alkaline Phosphatase: 47 U/L (ref 39–117)
Bilirubin, Direct: 0.1 mg/dL (ref 0.0–0.3)
Total Bilirubin: 0.3 mg/dL (ref 0.3–1.2)
Total Protein: 8.2 g/dL (ref 6.0–8.3)

## 2012-08-25 LAB — CBC WITH DIFFERENTIAL/PLATELET
Basophils Absolute: 0 10*3/uL (ref 0.0–0.1)
Basophils Relative: 0.2 % (ref 0.0–3.0)
Eosinophils Absolute: 0.2 10*3/uL (ref 0.0–0.7)
Eosinophils Relative: 2.4 % (ref 0.0–5.0)
HCT: 43 % (ref 39.0–52.0)
Hemoglobin: 14.5 g/dL (ref 13.0–17.0)
Lymphocytes Relative: 34.9 % (ref 12.0–46.0)
Lymphs Abs: 2.9 10*3/uL (ref 0.7–4.0)
MCHC: 33.8 g/dL (ref 30.0–36.0)
MCV: 91.2 fl (ref 78.0–100.0)
Monocytes Absolute: 0.6 10*3/uL (ref 0.1–1.0)
Monocytes Relative: 6.8 % (ref 3.0–12.0)
Neutro Abs: 4.6 10*3/uL (ref 1.4–7.7)
Neutrophils Relative %: 55.7 % (ref 43.0–77.0)
Platelets: 244 10*3/uL (ref 150.0–400.0)
RBC: 4.72 Mil/uL (ref 4.22–5.81)
RDW: 13.1 % (ref 11.5–14.6)
WBC: 8.2 10*3/uL (ref 4.5–10.5)

## 2012-08-25 LAB — BASIC METABOLIC PANEL
BUN: 20 mg/dL (ref 6–23)
CO2: 30 mEq/L (ref 19–32)
Calcium: 10.2 mg/dL (ref 8.4–10.5)
Chloride: 103 mEq/L (ref 96–112)
Creatinine, Ser: 1.1 mg/dL (ref 0.4–1.5)
GFR: 76.8 mL/min (ref >60.00)
Glucose, Bld: 81 mg/dL (ref 70–99)
Potassium: 4.4 mEq/L (ref 3.5–5.1)
Sodium: 142 mEq/L (ref 135–145)

## 2012-08-25 LAB — LDL CHOLESTEROL, DIRECT: Direct LDL: 140.9 mg/dL

## 2012-08-25 LAB — TSH: TSH: 2.41 u[IU]/mL (ref 0.35–5.50)

## 2012-08-25 LAB — PSA: PSA: 0.51 ng/mL (ref 0.10–4.00)

## 2012-08-28 ENCOUNTER — Encounter: Payer: Self-pay | Admitting: *Deleted

## 2012-09-25 ENCOUNTER — Ambulatory Visit (AMBULATORY_SURGERY_CENTER): Payer: 59 | Admitting: *Deleted

## 2012-09-25 ENCOUNTER — Encounter: Payer: Self-pay | Admitting: Internal Medicine

## 2012-09-25 VITALS — Ht 71.0 in | Wt 243.4 lb

## 2012-09-25 DIAGNOSIS — Z1211 Encounter for screening for malignant neoplasm of colon: Secondary | ICD-10-CM

## 2012-09-25 MED ORDER — NA SULFATE-K SULFATE-MG SULF 17.5-3.13-1.6 GM/177ML PO SOLN: ORAL | Status: DC

## 2012-09-27 ENCOUNTER — Encounter: Payer: Self-pay | Admitting: Family Medicine

## 2012-09-27 ENCOUNTER — Ambulatory Visit (INDEPENDENT_AMBULATORY_CARE_PROVIDER_SITE_OTHER): Payer: 59 | Admitting: Family Medicine

## 2012-09-27 VITALS — BP 126/84 | HR 64 | Temp 98.3°F | Wt 243.8 lb

## 2012-09-27 DIAGNOSIS — F411 Generalized anxiety disorder: Secondary | ICD-10-CM

## 2012-09-27 DIAGNOSIS — F329 Major depressive disorder, single episode, unspecified: Secondary | ICD-10-CM

## 2012-09-27 MED ORDER — SERTRALINE HCL 100 MG PO TABS: 100.0000 mg | ORAL_TABLET | Freq: Every day | ORAL | Status: DC

## 2012-09-27 NOTE — Progress Notes (Signed)
[facility] at [facility] [address] [place] [address] Phone: [phone] Fax: [phone]  Date:  09/27/2012   Name:  Erik [name]   DOB:  February 24, 1961   MRN:  [id number] Gender: male Age: 51 y.o.  PCP:  [name], MD    Chief Complaint: Follow-up   History of Present Illness:  Erik [name] is a 51 y.o. pleasant patient who presents with the following:    Patient Active Problem List  Diagnosis  . VENEREAL WART  . HYPERLIPIDEMIA  . ANXIETY  . DEPRESSION  . NASAL POLYP  . ALLERGIC RHINITIS  . GERD    Past Medical History  Diagnosis Date  . HYPERLIPIDEMIA 07/07/2009    Qualifier: Diagnosis of  By: Patsy Lager MD, Karleen Hampshire    . ANXIETY 06/04/2009    Qualifier: Diagnosis of  By: Patsy Lager MD, Karleen Hampshire    . DEPRESSION 06/04/2009    Qualifier: Diagnosis of  By: Clydene Pugh CMA (AAMA), Heather    . ALLERGIC RHINITIS 06/04/2009    Qualifier: Diagnosis of  By: Patsy Lager MD, Karleen Hampshire    . GERD 06/04/2009    Qualifier: Diagnosis of  By: Clydene Pugh CMA (AAMA), Heather    . Sleep apnea     uses cpap    Past Surgical History  Procedure Date  . Orif elbow fracture 1988    right  . Upper gastrointestinal endoscopy 1999    dilatation for stricture    History  Substance Use Topics  . Smoking status: Former Smoker    Types: Cigarettes    Quit date: 08/24/1992  . Smokeless tobacco: Never Used  . Alcohol Use: Yes     average 12 beers on weekend    Family History  Problem Relation Age of Onset  . Colon cancer Neg Hx   . Stomach cancer Neg Hx     No Known Allergies  Medication list has been reviewed and updated.  Outpatient Prescriptions Prior to Visit  Medication Sig Dispense Refill  . fluticasone (FLONASE) 50 MCG/ACT nasal spray USE 2 SPRAYS IN EACH NOSTRIL ONCE DAILY  48 g  3  . ibuprofen (ADVIL,MOTRIN) 800 MG tablet Take 800 mg by mouth daily.      Marland Kitchen loratadine (CLARITIN) 10 MG tablet Take 10 mg by mouth daily.      . Na Sulfate-K Sulfate-Mg Sulf (SUPREP  BOWEL PREP) SOLN suprep as directed.  No substitutions  354 mL  0  . Nutritional Supplements (JUICE PLUS FIBRE PO) Take by mouth daily.      Marland Kitchen omeprazole (PRILOSEC) 20 MG capsule TAKE 1 CAPSULE BY MOUTH ONCE A DAY  90 capsule  3  . sertraline (ZOLOFT) 50 MG tablet Take 1 tablet (50 mg total) by mouth daily.  30 tablet  3  . simvastatin (ZOCOR) 40 MG tablet TAKE 1 TABLET BY MOUTH AT BEDTIME  90 tablet  2    Physical Examination: Filed Vitals:   09/27/12 0805  BP: 126/84  Pulse: 64  Temp: 98.3 F (36.8 C)   Filed Vitals:   09/27/12 0805  Weight: 243 lb 12 oz (110.564 kg)   There is no height on file to calculate BMI. Ideal Body Weight:     GEN: WDWN, NAD, Non-toxic, Alert & Oriented x 3 HEENT: Atraumatic, Normocephalic.  Ears and Nose: No external deformity. EXTR: No clubbing/cyanosis/edema NEURO: Normal gait.  PSYCH: Normally interactive. Conversant. Not depressed or anxious appearing.  Calm demeanor.   Assessment and Plan: 1. DEPRESSION   2. ANXIETY     >  15 minutes spent in face to face time with patient, >50% spent in counselling or coordination of care: all spent with discussion about depression, doing only fairly. "I need more medication" Did OK with titration off celexa, felt kind of bad, some nausea. More emotional and irritable right now. Sleeping better. No si or hi. Increase zoloft and recheck in 1 month.  To 100 mg zoloft  [name], MD

## 2012-09-27 NOTE — Patient Instructions (Signed)
F/u 4-5 weeks 

## 2012-10-09 ENCOUNTER — Ambulatory Visit (AMBULATORY_SURGERY_CENTER): Payer: 59 | Admitting: Internal Medicine

## 2012-10-09 ENCOUNTER — Encounter: Payer: Self-pay | Admitting: Internal Medicine

## 2012-10-09 VITALS — BP 133/63 | HR 59 | Temp 98.2°F | Resp 16 | Ht 71.0 in | Wt 243.0 lb

## 2012-10-09 DIAGNOSIS — Z1211 Encounter for screening for malignant neoplasm of colon: Secondary | ICD-10-CM

## 2012-10-09 MED ORDER — SODIUM CHLORIDE 0.9 % IV SOLN: 500.0000 mL | INTRAVENOUS | Status: DC

## 2012-10-09 NOTE — Op Note (Signed)
[facility] [address] [place], [address]   COLONOSCOPY PROCEDURE REPORT  PATIENT: Erik [name], Erik [name]  MR#: [id number] BIRTHDATE: 11-Jul-1961 , 51  yrs. old GENDER: Male ENDOSCOPIST: Beverley Fiedler, MD REFERRED MW:[id number], Elpidio Galea. PROCEDURE DATE:  10/09/2012 PROCEDURE:   Colonoscopy, screening ASA CLASS:   Class II INDICATIONS:average risk screening and first colonoscopy. MEDICATIONS: MAC sedation, administered by CRNA and propofol (Diprivan) 250mg  IV  DESCRIPTION OF PROCEDURE:   After the risks benefits and alternatives of the procedure were thoroughly explained, informed consent was obtained.  A digital rectal exam revealed no rectal mass.   The LB [id number]  endoscope was introduced through the anus and advanced to the terminal ileum which was intubated for a short distance. No adverse events experienced.   The quality of the prep was Suprep good  The instrument was then slowly withdrawn as the colon was fully examined.     COLON FINDINGS: The mucosa appeared normal in the terminal ileum. A normal appearing cecum, ileocecal valve, and appendiceal orifice were identified.  The ascending, hepatic flexure, transverse, splenic flexure, descending, sigmoid colon and rectum appeared unremarkable.  No polyps or cancers were seen.   Retroflexed views revealed small internal hemorrhoids. The time to cecum=1 minutes 39 seconds.  Withdrawal time=7 minutes 42 seconds.  The scope was withdrawn and the procedure completed.  COMPLICATIONS: There were no complications.  ENDOSCOPIC IMPRESSION: 1.   Normal mucosa in the terminal ileum 2.   Normal colon 3.   Small internal hemorrhoids  RECOMMENDATIONS: You should continue to follow colorectal cancer screening guidelines for "routine risk" patients with a repeat colonoscopy in 10 years. There is no need for FOBT (stool) testing for at least 5 years.   eSigned:  Beverley Fiedler, MD 10/09/2012 10:12 AM   cc:  Juleen China, MD and The Patient

## 2012-10-09 NOTE — Patient Instructions (Addendum)
YOU HAD AN ENDOSCOPIC PROCEDURE TODAY AT THE [facility]: Refer to the procedure report that was given to you for any specific questions about what was found during the examination.  If the procedure report does not answer your questions, please call your gastroenterologist to clarify.  If you requested that your care partner not be given the details of your procedure findings, then the procedure report has been included in a sealed envelope for you to review at your convenience later.  YOU SHOULD EXPECT: Some feelings of bloating in the abdomen. Passage of more gas than usual.  Walking can help get rid of the air that was put into your GI tract during the procedure and reduce the bloating. If you had a lower endoscopy (such as a colonoscopy or flexible sigmoidoscopy) you may notice spotting of blood in your stool or on the toilet paper. If you underwent a bowel prep for your procedure, then you may not have a normal bowel movement for a few days.  DIET: Your first meal following the procedure should be a light meal and then it is ok to progress to your normal diet.  A half-sandwich or bowl of soup is an example of a good first meal.  Heavy or fried foods are harder to digest and may make you feel nauseous or bloated.  Likewise meals heavy in dairy and vegetables can cause extra gas to form and this can also increase the bloating.  Drink plenty of fluids but you should avoid alcoholic beverages for 24 hours.  ACTIVITY: Your care partner should take you home directly after the procedure.  You should plan to take it easy, moving slowly for the rest of the day.  You can resume normal activity the day after the procedure however you should NOT DRIVE or use heavy machinery for 24 hours (because of the sedation medicines used during the test).    SYMPTOMS TO REPORT IMMEDIATELY: A gastroenterologist can be reached at any hour.  During normal business hours, 8:30 AM to 5:00 PM Monday through Friday,  call [phone].  After hours and on weekends, please call the GI answering service at [phone] who will take a message and have the physician on call contact you.   Following lower endoscopy (colonoscopy or flexible sigmoidoscopy):  Excessive amounts of blood in the stool  Significant tenderness or worsening of abdominal pains  Swelling of the abdomen that is new, acute  Fever of 100F or higher   FOLLOW UP: Our staff will call the home number listed on your records the next business day following your procedure to check on you and address any questions or concerns that you may have at that time regarding the information given to you following your procedure. This is a courtesy call and so if there is no answer at the home number and we have not heard from you through the emergency physician on call, we will assume that you have returned to your regular daily activities without incident.  SIGNATURES/CONFIDENTIALITY: You and/or your care partner have signed paperwork which will be entered into your electronic medical record.  These signatures attest to the fact that that the information above on your After Visit Summary has been reviewed and is understood.  Full responsibility of the confidentiality of this discharge information lies with you and/or your care-partner.   Ok to resume your normal medications  Follow up colonoscopy in 10 years

## 2012-10-09 NOTE — Progress Notes (Signed)
Propofol per Jonn Shingles CRNA, all meds titrated per CRNA during procedure. See scanned intra procedure report. ewm

## 2012-10-09 NOTE — Progress Notes (Signed)
Patient did not experience any of the following events: a burn prior to discharge; a fall within the facility; wrong site/side/patient/procedure/implant event; or a hospital transfer or hospital admission upon discharge from the facility. (G8907) Patient did not have preoperative order for IV antibiotic SSI prophylaxis. (G8918)  

## 2012-10-10 ENCOUNTER — Telehealth: Payer: Self-pay

## 2012-10-10 NOTE — Telephone Encounter (Signed)
  Follow up Call-  Call back number 10/09/2012  Post procedure Call Back phone  # [phone] cell  Permission to leave phone message Yes     Patient questions:  Do you have a fever, pain , or abdominal swelling? no Pain Score  0 *  Have you tolerated food without any problems? yes  Have you been able to return to your normal activities? yes  Do you have any questions about your discharge instructions: Diet   no Medications  no Follow up visit  no  Do you have questions or concerns about your Care? no  Actions: * If pain score is 4 or above: No action needed, pain <4.   Per the pt everything went perfect. maw

## 2012-10-19 ENCOUNTER — Other Ambulatory Visit: Payer: Self-pay | Admitting: Family Medicine

## 2012-10-30 ENCOUNTER — Ambulatory Visit: Payer: 59 | Admitting: Family Medicine

## 2012-11-08 ENCOUNTER — Telehealth: Payer: Self-pay

## 2012-11-08 MED ORDER — ESCITALOPRAM OXALATE 10 MG PO TABS: 10.0000 mg | ORAL_TABLET | Freq: Every day | ORAL | Status: DC

## 2012-11-08 NOTE — Telephone Encounter (Signed)
Pt's wife said pt traveling with work; pt was started on Zoloft; pt said is not working; pt is irritable and more anxious, not sleeping well, no nausea. Pt request either increase in Zoloft or different med.[facility]. Pt wife request call back either pt or herself.

## 2012-11-08 NOTE — Telephone Encounter (Signed)
Patients wife advised and will call back for appt

## 2012-11-08 NOTE — Telephone Encounter (Signed)
Call  Take zoloft 1/2 tablet for 1 week,  Then start  lexapro 10 mg, 1 po daily, #30, 3 refills  Recheck with me in 5-6 weeks

## 2012-11-20 ENCOUNTER — Telehealth: Payer: Self-pay

## 2012-11-20 NOTE — Telephone Encounter (Signed)
Patients wife made appt for thursday

## 2012-11-20 NOTE — Telephone Encounter (Signed)
He needs to be seen in the office. Please have him come in - I will see him this afternoon if he can come.   Or I can see him thursday

## 2012-11-20 NOTE — Telephone Encounter (Signed)
pts wife left v/m pt started Lexapro last week; mood swings,depressio,anxiety and irritability has increased drastically. Pt's wife wants to know if another med can be added or change Lexapro to another med.Please advise.[facility].

## 2012-11-23 ENCOUNTER — Encounter: Payer: Self-pay | Admitting: Family Medicine

## 2012-11-23 ENCOUNTER — Other Ambulatory Visit: Payer: Self-pay | Admitting: *Deleted

## 2012-11-23 ENCOUNTER — Ambulatory Visit (INDEPENDENT_AMBULATORY_CARE_PROVIDER_SITE_OTHER): Payer: 59 | Admitting: Family Medicine

## 2012-11-23 VITALS — BP 120/82 | HR 64 | Temp 98.6°F | Ht 71.0 in | Wt 234.5 lb

## 2012-11-23 DIAGNOSIS — F329 Major depressive disorder, single episode, unspecified: Secondary | ICD-10-CM

## 2012-11-23 DIAGNOSIS — F3289 Other specified depressive episodes: Secondary | ICD-10-CM

## 2012-11-23 DIAGNOSIS — F411 Generalized anxiety disorder: Secondary | ICD-10-CM

## 2012-11-23 MED ORDER — ALPRAZOLAM ER 0.5 MG PO TB24: 0.5000 mg | ORAL_TABLET | ORAL | Status: DC

## 2012-11-23 MED ORDER — VENLAFAXINE HCL ER 75 MG PO CP24: 75.0000 mg | ORAL_CAPSULE | Freq: Every day | ORAL | Status: DC

## 2012-11-23 MED ORDER — IBUPROFEN 800 MG PO TABS: 800.0000 mg | ORAL_TABLET | Freq: Every day | ORAL | Status: DC

## 2012-11-23 NOTE — Patient Instructions (Signed)
F/u 4 weeks

## 2012-11-23 NOTE — Progress Notes (Signed)
   [facility] at [facility] [address] [place] [address] Phone: [phone] Fax: [phone]  Date:  11/23/2012   Name:  Erik [name]   DOB:  [birth year]   MRN:  [id number] Gender: male Age: 51 y.o.  PCP:  [name], MD  Evaluating MD: [name], MD   Chief Complaint: Anxiety   History of Present Illness:  Erik [name] is a 51 y.o. pleasant patient who presents with the following:  >15 minutes spent in face to face time with patient, >50% spent in counselling or coordination of care:  Had some mood stability, then worsening, worsened anxiety and irritability. Easily irritated with just about any situation, driving to put a screw on a bolt. Getting a dog on the house. Frustrated and agitated unless. Will come unglued.  Still only wantss to sleep, when at home, will go to bed by 8:30 - maybe 10-11 hours.  Some guilt.  No SI or HI Worse with zoloft, then worse with lexapro  Assessment and Plan:  1. ANXIETY   2. DEPRESSION    D/c current meds, change to below. He won't go to counselling.  Orders Today:  No orders of the defined types were placed in this encounter.    Updated Medication List: (Includes new medications, updates to list, dose adjustments) Meds ordered this encounter  Medications  . venlafaxine XR (EFFEXOR-XR) 75 MG 24 hr capsule    Sig: Take 1 capsule (75 mg total) by mouth daily.    Dispense:  30 capsule    Refill:  5  . ALPRAZolam (XANAX XR) 0.5 MG 24 hr tablet    Sig: Take 1 tablet (0.5 mg total) by mouth every morning.    Dispense:  30 tablet    Refill:  1    Medications Discontinued: Medications Discontinued During This Encounter  Medication Reason  . sertraline (ZOLOFT) 100 MG tablet Error  . escitalopram (LEXAPRO) 10 MG tablet      [name], MD

## 2012-12-21 ENCOUNTER — Other Ambulatory Visit: Payer: Self-pay | Admitting: *Deleted

## 2012-12-21 MED ORDER — OMEPRAZOLE 20 MG PO CPDR: 20.0000 mg | DELAYED_RELEASE_CAPSULE | Freq: Every day | ORAL | Status: DC

## 2012-12-21 MED ORDER — FLUTICASONE PROPIONATE 50 MCG/ACT NA SUSP: 2.0000 | Freq: Every day | NASAL | Status: DC

## 2012-12-22 NOTE — Telephone Encounter (Signed)
Pt said wanted omeprazole and flonase to go to [facility]. Pt notified done. Called med as instructed to Rob at [facility] and spoke with Heidi at [facility] to cancel rxs.

## 2012-12-25 ENCOUNTER — Encounter: Payer: Self-pay | Admitting: Family Medicine

## 2012-12-25 ENCOUNTER — Ambulatory Visit (INDEPENDENT_AMBULATORY_CARE_PROVIDER_SITE_OTHER): Payer: 59 | Admitting: Family Medicine

## 2012-12-25 VITALS — BP 120/84 | HR 79 | Temp 98.5°F | Ht 71.0 in | Wt 242.8 lb

## 2012-12-25 DIAGNOSIS — F411 Generalized anxiety disorder: Secondary | ICD-10-CM

## 2012-12-25 DIAGNOSIS — F329 Major depressive disorder, single episode, unspecified: Secondary | ICD-10-CM

## 2012-12-25 MED ORDER — VENLAFAXINE HCL ER 150 MG PO CP24: 150.0000 mg | ORAL_CAPSULE | Freq: Every day | ORAL | Status: DC

## 2012-12-25 NOTE — Patient Instructions (Addendum)
F/u 6 weeks 

## 2012-12-25 NOTE — Progress Notes (Signed)
[facility] [address] [place] [address] Phone: [phone] Fax: [phone]  Date:  12/25/2012   Name:  Erik [name]   DOB:  12/21/1960   MRN:  [id number] Gender: male Age: 52 y.o.  PCP:  [name], MD  Evaluating MD: [name], MD   Chief Complaint: Follow-up   History of Present Illness:  Erik [name] is a 52 y.o. pleasant patient who presents with the following:  Maybe a little bit better, but not so much. Wants things to be in order and in place , but now things are not so perfect. Still does not want to get out of bed, does not want to get out of bed. Sometimes will make him feel anxieous.   Patient Active Problem List  Diagnosis  . VENEREAL WART  . HYPERLIPIDEMIA  . ANXIETY  . DEPRESSION  . NASAL POLYP  . ALLERGIC RHINITIS  . GERD    Past Medical History  Diagnosis Date  . HYPERLIPIDEMIA 07/07/2009    Qualifier: Diagnosis of  By: Patsy Lager MD, Karleen Hampshire    . ANXIETY 06/04/2009    Qualifier: Diagnosis of  By: Patsy Lager MD, Karleen Hampshire    . DEPRESSION 06/04/2009    Qualifier: Diagnosis of  By: Clydene Pugh CMA (AAMA), Heather    . ALLERGIC RHINITIS 06/04/2009    Qualifier: Diagnosis of  By: Patsy Lager MD, Karleen Hampshire    . GERD 06/04/2009    Qualifier: Diagnosis of  By: Clydene Pugh CMA (AAMA), Heather    . Sleep apnea     uses cpap    Past Surgical History  Procedure Date  . Orif elbow fracture 1988    right  . Upper gastrointestinal endoscopy 1999    dilatation for stricture    History  Substance Use Topics  . Smoking status: Former Smoker    Types: Cigarettes    Quit date: 08/24/1992  . Smokeless tobacco: Never Used  . Alcohol Use: Yes     Comment: average 12 beers on weekend    Family History  Problem Relation Age of Onset  . Colon cancer Neg Hx   . Stomach cancer Neg Hx   . Esophageal cancer Neg Hx   . Rectal cancer Neg Hx     No Known Allergies  Medication list has been reviewed and updated.  Outpatient Prescriptions  Prior to Visit  Medication Sig Dispense Refill  . ALPRAZolam (XANAX XR) 0.5 MG 24 hr tablet Take 1 tablet (0.5 mg total) by mouth every morning.  30 tablet  1  . fluticasone (FLONASE) 50 MCG/ACT nasal spray Place 2 sprays into the nose daily.  48 g  3  . ibuprofen (ADVIL,MOTRIN) 800 MG tablet Take 1 tablet (800 mg total) by mouth daily.  30 tablet  5  . loratadine (CLARITIN) 10 MG tablet Take 10 mg by mouth daily.      . Nutritional Supplements (JUICE PLUS FIBRE PO) Take by mouth daily.      Marland Kitchen omeprazole (PRILOSEC) 20 MG capsule Take 1 capsule (20 mg total) by mouth daily.  90 capsule  2  . simvastatin (ZOCOR) 40 MG tablet TAKE 1 TABLET BY MOUTH AT BEDTIME  90 tablet  1  . venlafaxine XR (EFFEXOR-XR) 75 MG 24 hr capsule Take 1 capsule (75 mg total) by mouth daily.  30 capsule  5   Last reviewed on 12/25/2012  8:08 AM by Consuello Masse, CMA  Review of Systems:    Physical Examination: BP 120/84  Pulse 79  Temp 98.5 F (36.9 C) (Oral)  Ht 5\' 11"  (1.803 m)  Wt 242 lb 12 oz (110.111 kg)  BMI 33.86 kg/m2  Ideal Body Weight: Weight in (lb) to have BMI = 25: 178.9    GEN: WDWN, NAD, Non-toxic, Alert & Oriented x 3 HEENT: Atraumatic, Normocephalic.  Ears and Nose: No external deformity. EXTR: No clubbing/cyanosis/edema NEURO: Normal gait.  PSYCH: Normally interactive. Conversant. Flat affect   Assessment and Plan:  1. ANXIETY  Ambulatory referral to Psychiatry  2. DEPRESSION  Ambulatory referral to Psychiatry   >15 minutes spent in face to face time with patient, >50% spent in counselling or coordination of care: still feels depressed and anxious. Failure celexa, lexapro, zoloft, now on effexor xr 75. Has also been on low dose xanax xr, but he still does not feel right. Anhedonia +. Maybe a little bit better, but not so much. Wants things to be in order and in place , but now things are not so perfect. Still does not want to get out of bed, does not want to get out of bed. Sometimes  will make him feel anxieous. No guilt. No si/hi. I will increase effexor xr to 150 - and he is open to going to psychiatry, which I think would be helpful now with him failing multiple meds. Has not wanted to go to psychology.   Orders Today:  Orders Placed This Encounter  Procedures  . Ambulatory referral to Psychiatry    Referral Priority:  Routine    Referral Type:  Psychiatric    Referral Reason:  Specialty Services Required    Requested Specialty:  Psychiatry    Number of Visits Requested:  1    Updated Medication List: (Includes new medications, updates to list, dose adjustments) Meds ordered this encounter  Medications  . venlafaxine XR (EFFEXOR-XR) 150 MG 24 hr capsule    Sig: Take 1 capsule (150 mg total) by mouth daily.    Dispense:  30 capsule    Refill:  5    Medications Discontinued: Medications Discontinued During This Encounter  Medication Reason  . venlafaxine XR (EFFEXOR-XR) 75 MG 24 hr capsule Reorder     [name], MD

## 2013-01-02 ENCOUNTER — Telehealth: Payer: Self-pay

## 2013-01-02 NOTE — Telephone Encounter (Signed)
Pt left v/m was seen 12/25/12 and Venlafaxine was doubled from 75 mg to 150 mg due to feeling depressed; pt has not picked up the new rx for 150 mg and pt is feeling good and wants to continue Venlafaxine at 75 mg instead of increasing to 150 mg.[facility].Please advise.

## 2013-01-03 NOTE — Telephone Encounter (Signed)
That is a very reasonable idea -- stick to 75 mg

## 2013-01-03 NOTE — Telephone Encounter (Signed)
Patient advised.

## 2013-01-18 ENCOUNTER — Other Ambulatory Visit: Payer: Self-pay | Admitting: *Deleted

## 2013-01-18 MED ORDER — ALPRAZOLAM ER 0.5 MG PO TB24: 0.5000 mg | ORAL_TABLET | ORAL | Status: DC

## 2013-01-18 NOTE — Telephone Encounter (Signed)
Ok to refill.  #30, 1 refill 

## 2013-01-18 NOTE — Telephone Encounter (Signed)
rx called in

## 2013-02-15 ENCOUNTER — Other Ambulatory Visit: Payer: Self-pay | Admitting: *Deleted

## 2013-02-16 MED ORDER — ALPRAZOLAM ER 0.5 MG PO TB24: 0.5000 mg | ORAL_TABLET | ORAL | Status: DC

## 2013-02-16 NOTE — Telephone Encounter (Signed)
Ok to refill.  #30, 1 refill 

## 2013-02-16 NOTE — Telephone Encounter (Signed)
rx called to pharmacy 

## 2013-03-15 ENCOUNTER — Other Ambulatory Visit: Payer: Self-pay | Admitting: *Deleted

## 2013-03-15 MED ORDER — OMEPRAZOLE 20 MG PO CPDR: 20.0000 mg | DELAYED_RELEASE_CAPSULE | Freq: Every day | ORAL | Status: DC

## 2013-04-12 ENCOUNTER — Other Ambulatory Visit: Payer: Self-pay | Admitting: Family Medicine

## 2013-04-12 ENCOUNTER — Other Ambulatory Visit: Payer: Self-pay | Admitting: *Deleted

## 2013-04-12 MED ORDER — ALPRAZOLAM ER 0.5 MG PO TB24: 0.5000 mg | ORAL_TABLET | ORAL | Status: DC

## 2013-04-12 MED ORDER — FLUTICASONE PROPIONATE 50 MCG/ACT NA SUSP: 2.0000 | Freq: Every day | NASAL | Status: DC

## 2013-04-12 MED ORDER — SIMVASTATIN 40 MG PO TABS: ORAL_TABLET | ORAL | Status: DC

## 2013-04-12 NOTE — Telephone Encounter (Signed)
Received fax refill request.

## 2013-04-12 NOTE — Telephone Encounter (Signed)
Dr. Salena Saner  Pt. Last refill 7 weeks ago. Last OV 12/2012

## 2013-04-13 NOTE — Telephone Encounter (Signed)
rx called to pharmacy 

## 2013-04-19 ENCOUNTER — Other Ambulatory Visit: Payer: Self-pay | Admitting: *Deleted

## 2013-04-19 MED ORDER — SIMVASTATIN 40 MG PO TABS: ORAL_TABLET | ORAL | Status: DC

## 2013-04-19 MED ORDER — VENLAFAXINE HCL ER 150 MG PO CP24: 150.0000 mg | ORAL_CAPSULE | Freq: Every day | ORAL | Status: DC

## 2013-04-23 ENCOUNTER — Telehealth: Payer: Self-pay

## 2013-04-23 NOTE — Telephone Encounter (Signed)
Pharmacy advised and medication list updated

## 2013-04-23 NOTE — Telephone Encounter (Signed)
He never took the 150's, he is on 50.

## 2013-04-23 NOTE — Telephone Encounter (Signed)
[facility] left v/m to verify dosage of venlafaxine XR. Pt picked up Fenlafaxine XR 150 mg this weekend and did not know med had been increased. (Rob said pt has not taken Venlafaxine XR 150 mg). Pt has been taking Fenlafaxine XR 75 mg taking one daily. Please advise. (See 01/02/13 phone note).

## 2013-06-13 ENCOUNTER — Encounter: Payer: Self-pay | Admitting: Radiology

## 2013-06-14 ENCOUNTER — Other Ambulatory Visit: Payer: Self-pay | Admitting: Family Medicine

## 2013-06-14 ENCOUNTER — Ambulatory Visit: Payer: 59 | Admitting: Family Medicine

## 2013-06-14 MED ORDER — ALPRAZOLAM ER 0.5 MG PO TB24: 0.5000 mg | ORAL_TABLET | ORAL | Status: DC

## 2013-06-14 NOTE — Telephone Encounter (Signed)
Fax refill request.

## 2013-06-14 NOTE — Telephone Encounter (Signed)
Ok to refill.  #30, 1 refill 

## 2013-06-14 NOTE — Telephone Encounter (Signed)
rx called to pharmacy 

## 2013-08-16 ENCOUNTER — Other Ambulatory Visit: Payer: Self-pay | Admitting: Family Medicine

## 2013-08-16 NOTE — Telephone Encounter (Signed)
Ok to refill. Please call in   Xanax xr 0.5 mg, 1 po daily, #30, 2 refills  Flonase, #1, 11 refills. 2 sprays each nostril daily.

## 2013-08-16 NOTE — Telephone Encounter (Signed)
Last office visit 12/25/2012.  Ok to refill? 

## 2013-09-13 ENCOUNTER — Other Ambulatory Visit: Payer: Self-pay | Admitting: Family Medicine

## 2013-09-13 NOTE — Telephone Encounter (Signed)
Last office visit 12/25/2012.  Ok to refill? 

## 2013-11-22 ENCOUNTER — Telehealth: Payer: Self-pay | Admitting: Family Medicine

## 2013-11-22 NOTE — Telephone Encounter (Signed)
Noted  

## 2013-11-22 NOTE — Telephone Encounter (Signed)
Patient Information:  Caller Name: Dare  Phone: [phone]  Patient: Erik [name], Erik [name]  Gender: Male  DOB: [birth year]  Age: 52 Years  PCP: [name] (Family Practice)  Office Follow Up:  Does the office need to follow up with this patient?: No  Instructions For The Office: N/A  RN Note:  Home care advice and call back parameters reviewed. Encouraged to call back for full triage and assistance.   Understanding expressed.  Symptoms  Reason For Call & Symptoms: Wife states onset Friday 11/16/13  Diarrhea, nausea, stomach cramping. Afebrile.  No other family members ill.  stools x 6-9  daily.  Described as loose to liquid. She is not home with patient to ask all triage questions. Advised caller to call back when she is with patient for full triage.  Understanding expressed.  Reviewed Health History In EMR: Yes  Reviewed Medications In EMR: Yes  Reviewed Allergies In EMR: Yes  Reviewed Surgeries / Procedures: Yes  Date of Onset of Symptoms: 11/16/2013  Treatments Tried: Parke Simmers foods, fluids  Treatments Tried Worked: No  Guideline(s) Used:  Diarrhea  Disposition Per Guideline:   Home Care  Reason For Disposition Reached:   Mild diarrhea  Advice Given:  Reassurance:  In healthy adults, new-onset diarrhea is usually caused by a viral infection of the intestines, which you can treat at home. Diarrhea is the body's way of getting rid of the infection. Here are some tips on how to keep ahead of the fluid losses.  Here is some care advice that should help.  Fluids:  Drink more fluids, at least 8-10 glasses (8 oz or 240 ml) daily.  For example: sports drinks, diluted fruit juices, soft drinks.  Supplement this with saltine crackers or soups to make certain that you are getting sufficient fluid and salt to meet your body's needs.  Avoid caffeinated beverages (Reason: caffeine is mildly dehydrating).  Nutrition:  Maintaining some food intake during episodes of diarrhea is important.  Ideal initial foods include boiled starches/cereals (e.g., potatoes, Unrein, noodles, wheat, oats) with a small amount of salt to taste.  Other acceptable foods include: bananas, yogurt, crackers, soup.  Diarrhea Medication - Bismuth Subsalicylate (e.g., Kaopectate, PeptoBismol):  Helps reduce diarrhea, vomiting, and abdominal cramping.  Adult dosage: 2 tablets or 2 tablespoons (30 ml) by mouth every hour if diarrhea continues to a maximum of 8 doses in a 24-hour period.  This medication can make the stools look dark or even black (but not red or tarry).  Expected Course:  Viral diarrhea lasts 4-7 days. Always worse on days 1 and 2.  Call Back If:  Signs of dehydration occur (e.g., no urine for more than 12 hours, very dry mouth, lightheaded, etc.)  Diarrhea lasts over 7 days  You become worse.  Patient Will Follow Care Advice:  YES

## 2013-11-22 NOTE — Telephone Encounter (Signed)
Agree with recs.

## 2013-12-11 ENCOUNTER — Other Ambulatory Visit: Payer: Self-pay | Admitting: Family Medicine

## 2013-12-12 ENCOUNTER — Other Ambulatory Visit: Payer: Self-pay | Admitting: Family Medicine

## 2014-01-04 ENCOUNTER — Other Ambulatory Visit: Payer: Self-pay | Admitting: Family Medicine

## 2014-01-04 NOTE — Telephone Encounter (Signed)
Last office visit 12/25/2012.  Refill?

## 2014-01-08 NOTE — Telephone Encounter (Signed)
Erik [name] notified prescriptions have been sent to his pharmacy. Informed Tawanna Coolerodd that he would need to schedule a CPE with Dr. Patsy Lageropland sometime this Spring.  Patient will call back and schedule.

## 2014-01-08 NOTE — Telephone Encounter (Signed)
Alprazolam called to [facility]. 

## 2014-01-08 NOTE — Telephone Encounter (Signed)
Ok to renew 30, 3 refills both  Come in for cpx this spring

## 2014-02-25 ENCOUNTER — Telehealth: Payer: Self-pay | Admitting: Family Medicine

## 2014-02-25 ENCOUNTER — Other Ambulatory Visit: Payer: Self-pay | Admitting: Family Medicine

## 2014-02-25 MED ORDER — PREDNISONE 20 MG PO TABS: ORAL_TABLET | ORAL | Status: DC

## 2014-02-25 NOTE — Telephone Encounter (Signed)
done

## 2014-02-25 NOTE — Telephone Encounter (Signed)
Dare(wife) notified that Dr. Patsy Lageropland will send in [facility]  [facility]

## 2014-02-25 NOTE — Telephone Encounter (Signed)
Patient Information:  Caller Name: Dare  Phone: [phone]  Patient: Marda StalkerRice, Willliam  Gender: Male  DOB: 01-10-1961  Age: [age] Years  PCP: Hannah Beatopland, Spencer (Family Practice)  Office Follow Up:  Does the office need to follow up with this patient?: Yes  Instructions For The Office: Please follow up with caller regarding possible appointment 02/25/14; caller declined appointment at any other location, reports that he will be near the office with his truck around 16:30.   Symptoms  Reason For Call & Symptoms: Dare/ spouse calling about Tawanna Coolerodd. Reports he has poison oak on his arms, legs and different areas.  Emergent symptoms ruled out.  See Today in Office due to Severe Poison Ivy reaction in the past (required steroids, caller states it looked like a spider bite eating into his skin).  Reviewed Health History In EMR: Yes  Reviewed Medications In EMR: Yes  Reviewed Allergies In EMR: Yes  Reviewed Surgeries / Procedures: Yes  Date of Onset of Symptoms: 02/25/2014  Guideline(s) Used:  Poison Ivy - [facility]  Disposition Per Guideline:   See Today in Office  Reason For Disposition Reached:   Severe Poison Ivy reaction in the past  Advice Given:  Apply Cold to the Area:  Soak the involved area in cool water for 20 minutes or massage it with an ice cube as often as necessary to reduce itching and oozing.  Oral Antihistamine Medication for Itching:   Do not take antihistamine medications if you have prostate problems.  Antihistamines may cause sleepiness. Do not drink, drive, or operate dangerous machinery while taking antihistamines.  Avoid Scratching:   Cut your fingernails short and try not to scratch so as to prevent a secondary infection from bacteria.  Contagiousness:  Poison ivy or oak is not contagious to others.  Expected Course:  Usually lasts 2 weeks. Treatment reduces the severity of the symptoms, not how long they last.  Call Back If:  You become worse.  Patient Will  Follow Care Advice:  YES

## 2014-03-04 ENCOUNTER — Other Ambulatory Visit: Payer: Self-pay | Admitting: Family Medicine

## 2014-03-25 ENCOUNTER — Encounter: Payer: Self-pay | Admitting: *Deleted

## 2014-03-25 ENCOUNTER — Encounter: Payer: Self-pay | Admitting: Family Medicine

## 2014-03-25 ENCOUNTER — Ambulatory Visit (INDEPENDENT_AMBULATORY_CARE_PROVIDER_SITE_OTHER): Payer: 59 | Admitting: Family Medicine

## 2014-03-25 VITALS — BP 130/88 | HR 72 | Temp 97.9°F | Ht 70.25 in | Wt 236.0 lb

## 2014-03-25 DIAGNOSIS — R5383 Other fatigue: Secondary | ICD-10-CM

## 2014-03-25 DIAGNOSIS — R5381 Other malaise: Secondary | ICD-10-CM

## 2014-03-25 DIAGNOSIS — E785 Hyperlipidemia, unspecified: Secondary | ICD-10-CM

## 2014-03-25 DIAGNOSIS — Z131 Encounter for screening for diabetes mellitus: Secondary | ICD-10-CM

## 2014-03-25 DIAGNOSIS — Z0000 Encounter for general adult medical examination without abnormal findings: Secondary | ICD-10-CM

## 2014-03-25 DIAGNOSIS — Z23 Encounter for immunization: Secondary | ICD-10-CM

## 2014-03-25 DIAGNOSIS — Z125 Encounter for screening for malignant neoplasm of prostate: Secondary | ICD-10-CM

## 2014-03-25 LAB — CBC WITH DIFFERENTIAL/PLATELET
Basophils Absolute: 0 10*3/uL (ref 0.0–0.1)
Basophils Relative: 0.4 % (ref 0.0–3.0)
Eosinophils Absolute: 0.1 10*3/uL (ref 0.0–0.7)
Eosinophils Relative: 1.3 % (ref 0.0–5.0)
HCT: 43.4 % (ref 39.0–52.0)
Hemoglobin: 14.3 g/dL (ref 13.0–17.0)
Lymphocytes Relative: 33 % (ref 12.0–46.0)
Lymphs Abs: 2 10*3/uL (ref 0.7–4.0)
MCHC: 33.1 g/dL (ref 30.0–36.0)
MCV: 90.6 fl (ref 78.0–100.0)
Monocytes Absolute: 0.3 10*3/uL (ref 0.1–1.0)
Monocytes Relative: 5.1 % (ref 3.0–12.0)
Neutro Abs: 3.6 10*3/uL (ref 1.4–7.7)
Neutrophils Relative %: 60.2 % (ref 43.0–77.0)
Platelets: 241 10*3/uL (ref 150.0–400.0)
RBC: 4.79 Mil/uL (ref 4.22–5.81)
RDW: 12.8 % (ref 11.5–14.6)
WBC: 6 10*3/uL (ref 4.5–10.5)

## 2014-03-25 LAB — HEPATIC FUNCTION PANEL
ALT: 34 U/L (ref 0–53)
AST: 18 U/L (ref 0–37)
Albumin: 4.6 g/dL (ref 3.5–5.2)
Alkaline Phosphatase: 43 U/L (ref 39–117)
Bilirubin, Direct: 0.1 mg/dL (ref 0.0–0.3)
Total Bilirubin: 0.4 mg/dL (ref 0.3–1.2)
Total Protein: 7.5 g/dL (ref 6.0–8.3)

## 2014-03-25 LAB — BASIC METABOLIC PANEL
BUN: 17 mg/dL (ref 6–23)
CO2: 30 mEq/L (ref 19–32)
Calcium: 9.4 mg/dL (ref 8.4–10.5)
Chloride: 102 mEq/L (ref 96–112)
Creatinine, Ser: 1 mg/dL (ref 0.4–1.5)
GFR: 81.52 mL/min (ref >60.00)
Glucose, Bld: 103 mg/dL — ABNORMAL HIGH (ref 70–99)
Potassium: 4.3 mEq/L (ref 3.5–5.1)
Sodium: 139 mEq/L (ref 135–145)

## 2014-03-25 LAB — LIPID PANEL
Cholesterol: 186 mg/dL (ref 0–200)
HDL: 59.5 mg/dL (ref >39.00)
LDL Cholesterol: 110 mg/dL — ABNORMAL HIGH (ref 0–99)
Total CHOL/HDL Ratio: 3
Triglycerides: 85 mg/dL (ref 0.0–149.0)
VLDL: 17 mg/dL (ref 0.0–40.0)

## 2014-03-25 LAB — PSA: PSA: 0.56 ng/mL (ref 0.10–4.00)

## 2014-03-25 MED ORDER — SIMVASTATIN 40 MG PO TABS: ORAL_TABLET | ORAL | Status: DC

## 2014-03-25 MED ORDER — ALPRAZOLAM ER 0.5 MG PO TB24: ORAL_TABLET | ORAL | Status: DC

## 2014-03-25 MED ORDER — OMEPRAZOLE 20 MG PO CPDR: DELAYED_RELEASE_CAPSULE | ORAL | Status: DC

## 2014-03-25 MED ORDER — FLUTICASONE PROPIONATE 50 MCG/ACT NA SUSP: NASAL | Status: DC

## 2014-03-25 MED ORDER — VENLAFAXINE HCL ER 75 MG PO CP24: ORAL_CAPSULE | ORAL | Status: DC

## 2014-03-25 NOTE — Progress Notes (Signed)
Pre visit review using our clinic review tool, if applicable. No additional management support is needed unless otherwise documented below in the visit note. 

## 2014-03-25 NOTE — Progress Notes (Signed)
Date:  03/25/2014   Name:  Erik [name]   DOB:  December 07, 1961   MRN:  [id number] Gender: male Age: 53 y.o.  Primary Physician:  [name], MD   Chief Complaint: Annual Exam   Subjective:   History of Present Illness:  Erik [name] is a 53 y.o. pleasant patient who presents with the following:  Preventative Health Maintenance Visit:  Health Maintenance Summary Reviewed and updated, unless pt declines services.  Tobacco History Reviewed. Alcohol: No concerns, no excessive use Exercise Habits: Some activity, rec at least 30 mins 5 times a week STD concerns: no risk or activity to increase risk Drug Use: None Encouraged self-testicular check  Health Maintenance  Topic Date Due  . Tetanus/tdap  04/02/1981  . Influenza Vaccine  07/20/2014  . Colonoscopy  10/09/2022     There is no immunization history on file for this patient.  Patient Active Problem List   Diagnosis Date Noted  . NASAL POLYP 07/06/2010  . HYPERLIPIDEMIA 07/07/2009  . VENEREAL WART 06/04/2009  . ANXIETY 06/04/2009  . DEPRESSION 06/04/2009  . ALLERGIC RHINITIS 06/04/2009  . GERD 06/04/2009    Past Medical History  Diagnosis Date  . HYPERLIPIDEMIA 07/07/2009    Qualifier: Diagnosis of  By: Patsy Lager MD, Karleen Hampshire    . ANXIETY 06/04/2009    Qualifier: Diagnosis of  By: Patsy Lager MD, Karleen Hampshire    . DEPRESSION 06/04/2009    Qualifier: Diagnosis of  By: Clydene Pugh CMA (AAMA), Heather    . ALLERGIC RHINITIS 06/04/2009    Qualifier: Diagnosis of  By: Patsy Lager MD, Karleen Hampshire    . GERD 06/04/2009    Qualifier: Diagnosis of  By: Clydene Pugh CMA (AAMA), Heather    . Sleep apnea     uses cpap    Past Surgical History  Procedure Laterality Date  . Orif elbow fracture  1988    right  . Upper gastrointestinal endoscopy  1999    dilatation for stricture    History   Social History  . Marital Status: Married    Spouse Name: N/A    Number of Children: N/A  . Years of Education: N/A   Occupational History  . Not on file.    Social History Main Topics  . Smoking status: Former Smoker    Types: Cigarettes    Quit date: 08/24/1992  . Smokeless tobacco: Never Used  . Alcohol Use: Yes     Comment: average 12 beers on weekend  . Drug Use: No  . Sexual Activity: Not on file   Other Topics Concern  . Not on file   Social History Narrative  . No narrative on file    Family History  Problem Relation Age of Onset  . Colon cancer Neg Hx   . Stomach cancer Neg Hx   . Esophageal cancer Neg Hx   . Rectal cancer Neg Hx     No Known Allergies  Medication list has been reviewed and updated.  Review of Systems:  General: Denies fever, chills, sweats. No significant weight loss. Eyes: Denies blurring,significant itching ENT: Denies earache, sore throat, and hoarseness. Cardiovascular: Denies chest pains, palpitations, dyspnea on exertion Respiratory: Denies cough, dyspnea at rest,wheeezing Breast: no concerns about lumps GI: Denies nausea, vomiting, diarrhea, constipation, change in bowel habits, abdominal pain, melena, hematochezia GU: Denies penile discharge, ED, urinary flow / outflow problems. No STD concerns. Musculoskeletal: Denies back pain, joint pain Derm: Denies rash, itching Neuro: Denies  paresthesias, frequent falls, frequent headaches Psych: Denies depression, anxiety Endocrine:  Denies cold intolerance, heat intolerance, polydipsia Heme: Denies enlarged lymph nodes Allergy: No hayfever  Objective:   Physical Examination: BP 130/88  Pulse 72  Temp(Src) 97.9 F (36.6 C) (Oral)  Ht 5' 10.25" (1.784 m)  Wt 236 lb (107.049 kg)  BMI 33.64 kg/m2 Ideal Body Weight: Weight in (lb) to have BMI = 25: 175.1  GEN: well developed, well nourished, no acute distress Eyes: conjunctiva and lids normal, PERRLA, EOMI ENT: TM clear, nares clear, oral exam WNL Neck: supple, no lymphadenopathy, no thyromegaly, no JVD Pulm: clear to auscultation and percussion, respiratory effort normal CV: regular  rate and rhythm, S1-S2, no murmur, rub or gallop, no bruits, peripheral pulses normal and symmetric, no cyanosis, clubbing, edema or varicosities GI: soft, non-tender; no hepatosplenomegaly, masses; active bowel sounds all quadrants GU: no hernia, testicular mass, penile discharge Lymph: no cervical, axillary or inguinal adenopathy MSK: gait normal, muscle tone and strength WNL, no joint swelling, effusions, discoloration, crepitus  SKIN: clear, good turgor, color WNL, no rashes, lesions, or ulcerations Neuro: normal mental status, normal strength, sensation, and motion Psych: alert; oriented to person, place and time, normally interactive and not anxious or depressed in appearance.  All labs reviewed with patient.  Lipids:    Component Value Date/Time   CHOL 165 01/27/2011 0822   TRIG 114.0 01/27/2011 0822   HDL 43.40 01/27/2011 0822   LDLDIRECT 140.9 08/24/2012 1659   VLDL 22.8 01/27/2011 0822   CHOLHDL 4 01/27/2011 0822    CBC:    Component Value Date/Time   WBC 8.2 08/24/2012 1659   HGB 14.5 08/24/2012 1659   HCT 43.0 08/24/2012 1659   PLT 244.0 08/24/2012 1659   MCV 91.2 08/24/2012 1659   NEUTROABS 4.6 08/24/2012 1659   LYMPHSABS 2.9 08/24/2012 1659   MONOABS 0.6 08/24/2012 1659   EOSABS 0.2 08/24/2012 1659   BASOSABS 0.0 08/24/2012 1659    Basic Metabolic Panel:    Component Value Date/Time   NA 142 08/24/2012 1659   K 4.4 08/24/2012 1659   CL 103 08/24/2012 1659   CO2 30 08/24/2012 1659   BUN 20 08/24/2012 1659   CREATININE 1.1 08/24/2012 1659   GLUCOSE 81 08/24/2012 1659   CALCIUM 10.2 08/24/2012 1659    Lab Results  Component Value Date   ALT 33 08/24/2012   AST 26 08/24/2012   ALKPHOS 47 08/24/2012   BILITOT 0.3 08/24/2012    Lab Results  Component Value Date   TSH 2.41 08/24/2012    Lab Results  Component Value Date   PSA 0.51 08/24/2012   PSA 0.48 01/27/2011   PSA 0.69 07/01/2009    Assessment & Plan:   Health Maintenance Exam: The patient's preventative maintenance and recommended screening  tests for an annual wellness exam were reviewed in full today. Brought up to date unless services declined.  Counselled on the importance of diet, exercise, and its role in overall health and mortality. The patient's FH and SH was reviewed, including their home life, tobacco status, and drug and alcohol status.  Routine general medical examination at a health care facility  HYPERLIPIDEMIA - Plan: Lipid panel  Special screening for malignant neoplasm of prostate - Plan: PSA  Screening for diabetes mellitus - Plan: Basic metabolic panel  Other malaise and fatigue - Plan: CBC with Differential, Hepatic function panel  Tdap today Labs O/w doing well  No Follow-up on file.  Orders Placed This Encounter  Procedures  . Basic metabolic panel  . CBC with Differential  .  Lipid panel  . Hepatic function panel  . PSA   Patient's Medications  New Prescriptions   No medications on file  Previous Medications   LORATADINE (CLARITIN) 10 MG TABLET    Take 10 mg by mouth daily.  Modified Medications   Modified Medication Previous Medication   ALPRAZOLAM (ALPRAZOLAM XR) 0.5 MG 24 HR TABLET ALPRAZOLAM XR 0.5 MG 24 hr tablet      TAKE 1 TABLET BY MOUTH DAILY    TAKE 1 TABLET BY MOUTH DAILY   FLUTICASONE (FLONASE) 50 MCG/ACT NASAL SPRAY fluticasone (FLONASE) 50 MCG/ACT nasal spray      INHALE 2 SPRAYS INTO EACH NOSTRIL EVERY DAY.    INHALE 2 SPRAYS INTO EACH NOSTRIL EVERY DAY.   OMEPRAZOLE (PRILOSEC) 20 MG CAPSULE omeprazole (PRILOSEC) 20 MG capsule      TAKE ONE CAPSULE BY MOUTH DAILY    TAKE ONE CAPSULE BY MOUTH DAILY   SIMVASTATIN (ZOCOR) 40 MG TABLET simvastatin (ZOCOR) 40 MG tablet      TAKE ONE TABLET BY MOUTH EVERY NIGHT AT BEDTIME    TAKE ONE TABLET BY MOUTH EVERY NIGHT AT BEDTIME   VENLAFAXINE XR (EFFEXOR-XR) 75 MG 24 HR CAPSULE venlafaxine XR (EFFEXOR-XR) 75 MG 24 hr capsule      TAKE ONE CAPSULE BY MOUTH DAILY    TAKE ONE CAPSULE BY MOUTH DAILY  Discontinued Medications    IBUPROFEN (ADVIL,MOTRIN) 800 MG TABLET    Take 1 tablet (800 mg total) by mouth daily.   NUTRITIONAL SUPPLEMENTS (JUICE PLUS FIBRE PO)    Take by mouth daily.   PREDNISONE (DELTASONE) 20 MG TABLET    2 po for 1 week, then 1 po for 1 week   There are no Patient Instructions on file for this visit.  Signed,  Elpidio GaleaSpencer T. Alyan Hartline, MD, CAQ Sports Medicine  [facility] [address] [place] [address] Phone: [phone] Fax: [phone]

## 2014-04-17 ENCOUNTER — Emergency Department: Payer: Self-pay | Admitting: Emergency Medicine

## 2014-04-17 LAB — COMPREHENSIVE METABOLIC PANEL
Albumin: 4.8 g/dL (ref 3.4–5.0)
Alkaline Phosphatase: 61 U/L
Anion Gap: 3 — ABNORMAL LOW (ref 7–16)
BUN: 17 mg/dL (ref 7–18)
Bilirubin,Total: 0.6 mg/dL (ref 0.2–1.0)
Calcium, Total: 9.2 mg/dL (ref 8.5–10.1)
Chloride: 102 mmol/L (ref 98–107)
Co2: 30 mmol/L (ref 21–32)
Creatinine: 0.94 mg/dL (ref 0.60–1.30)
EGFR (African American): >60
EGFR (Non-African Amer.): >60
Glucose: 122 mg/dL — ABNORMAL HIGH (ref 65–99)
Osmolality: 273 (ref 275–301)
Potassium: 4.1 mmol/L (ref 3.5–5.1)
SGOT(AST): 31 U/L (ref 15–37)
SGPT (ALT): 44 U/L (ref 12–78)
Sodium: 135 mmol/L — ABNORMAL LOW (ref 136–145)
Total Protein: 8.5 g/dL — ABNORMAL HIGH (ref 6.4–8.2)

## 2014-04-17 LAB — CBC
HCT: 46.8 % (ref 40.0–52.0)
HGB: 15.5 g/dL (ref 13.0–18.0)
MCH: 29.7 pg (ref 26.0–34.0)
MCHC: 33.1 g/dL (ref 32.0–36.0)
MCV: 90 fL (ref 80–100)
Platelet: 207 10*3/uL (ref 150–440)
RBC: 5.23 10*6/uL (ref 4.40–5.90)
RDW: 13.1 % (ref 11.5–14.5)
WBC: 14.7 10*3/uL — ABNORMAL HIGH (ref 3.8–10.6)

## 2014-04-17 LAB — TROPONIN I: Troponin-I: <0.02 ng/mL

## 2014-04-17 LAB — CK TOTAL AND CKMB (NOT AT ARMC)
CK, Total: 194 U/L
CK-MB: 0.5 ng/mL (ref 0.5–3.6)

## 2014-04-18 ENCOUNTER — Telehealth: Payer: Self-pay

## 2014-04-18 LAB — URINALYSIS, COMPLETE
Bacteria: NONE SEEN
Bilirubin,UR: NEGATIVE
Glucose,UR: NEGATIVE mg/dL (ref 0–75)
Leukocyte Esterase: NEGATIVE
Nitrite: NEGATIVE
Ph: 6 (ref 4.5–8.0)
Protein: NEGATIVE
RBC,UR: 1 /HPF (ref 0–5)
Specific Gravity: 1.017 (ref 1.003–1.030)
Squamous Epithelial: NONE SEEN
WBC UR: 1 /HPF (ref 0–5)

## 2014-04-18 LAB — LIPASE, BLOOD: Lipase: 163 U/L (ref 73–393)

## 2014-04-18 NOTE — Telephone Encounter (Signed)
ED work-up c/w gastroenteritis.   Normal CT of abd/pelvis. Normal EKG. Normal cardiac enzymes, normal lipiase, normal HFP. WBC mildly elevated  Along with diarrhea most c/w gastroenteritis.   Recheck tomorrow appropriate. Try to see if Dr. Leonard SchwartzB can evaluate ER f/u.  ER notes all reviewed.  Electronically Signed  By: Hannah BeatSpencer Demere Dotzler, MD On: 04/18/2014 1:51 PM

## 2014-04-18 NOTE — Telephone Encounter (Signed)
Mrs Dimple CaseyRice said pt had nausea and tired feeling yesterday and last evening pt became more nauseated and began with diarrhea.When pt became lightheaded, EMT called and BP at home was 166/122; was [facility]; pt was d/c from ED at 4:30 AM; BP at D/C was 157/101and BS was 151, was given Bentyl rx and advised to rest and see PCP in 2 days.Mrs Dimple CaseyRice was told pts WBC was elevated. Mrs Dimple CaseyRice said pt is presently very nauseated,lightheaded and still has diarrhea;at times pt has chills and pt becomes anxious then has episode of SOB. No abd pain but abd feels uncomfortable. Pt had H/A last night but no H/A now and no SOB now. No CP or fever. Mrs Dimple CaseyRice wants to know if pt should wait 2 days to be followed up? Mrs Rowe request cb. [facility]. [facility] ED [facility] have been requested.Please advise.

## 2014-04-18 NOTE — Telephone Encounter (Signed)
Recommend follow-up today or tomorrow. Dr. Ermalene SearingBedsole tomorrow if possible. Today is a day where I cannot overbook more patients.   Possibly one of my partners would be able to work him in today.  We will review ER records when available.

## 2014-04-18 NOTE — Telephone Encounter (Signed)
Dare notified as instructed by telephone.  Appointment scheduled with Dr. Ermalene SearingBedsole for 04/19/2014 @ 10:30am.

## 2014-04-18 NOTE — Telephone Encounter (Signed)
Records received and placed in Dr. Cyndie Chimeopland's in box for review.  Will wait to schedule appointment until after Dr. Patsy Lageropland reviews records. Please Advise.  No available appointment today.

## 2014-04-19 ENCOUNTER — Encounter: Payer: Self-pay | Admitting: Family Medicine

## 2014-04-19 ENCOUNTER — Ambulatory Visit (INDEPENDENT_AMBULATORY_CARE_PROVIDER_SITE_OTHER): Payer: 59 | Admitting: Family Medicine

## 2014-04-19 VITALS — BP 130/90 | HR 74 | Temp 98.1°F | Ht 70.25 in | Wt 235.0 lb

## 2014-04-19 DIAGNOSIS — R9431 Abnormal electrocardiogram [ECG] [EKG]: Secondary | ICD-10-CM

## 2014-04-19 DIAGNOSIS — K529 Noninfective gastroenteritis and colitis, unspecified: Secondary | ICD-10-CM

## 2014-04-19 DIAGNOSIS — K5289 Other specified noninfective gastroenteritis and colitis: Secondary | ICD-10-CM

## 2014-04-19 NOTE — Assessment & Plan Note (Signed)
Likely due to toxin given speed of onset.  Resolving. Push fluids, return to normal diet.

## 2014-04-19 NOTE — Progress Notes (Signed)
Pre visit review using our clinic review tool, if applicable. No additional management support is needed unless otherwise documented below in the visit note. 

## 2014-04-19 NOTE — Patient Instructions (Signed)
Push fluids, return to normal diet.

## 2014-04-19 NOTE — Progress Notes (Signed)
   Subjective:    Patient ID: Erik [name], male    DOB: July 12, 1961, 53 y.o.   MRN: [id number]  HPI 53 year old pt of Dr. Cyndie Chimeopland's presents for ER follow up.  [facility] for  severe nausea,diarrhea.  His wife reports he was pale, sweaty, couldn't breath, chills. Sudden onset. He had only one sandwich [employer] that day. Bloated. No blood in stool.  No emesis.  No fever.  No CP.  Wife called EMS. Sugar was 151. BP 166/122.  [facility]. He did not like his experience there.. everyone was very cold, etc, poor communicaiton.   CMET, CBC, UA, lipase, tropin all normal except wbc 14.7. EKG: NSR, prolonged QT new compared to 2010. CT abd pelvis: no acute abnormalities   Dx with viral gastroenteritis.  Today: Her reports feeling better, continued diarrhea, but slowing down. No abdominal pain. He almost feels back to normal.    No recent syncope.  Review of Systems  Constitutional: Negative for fever and fatigue.  HENT: Negative for ear pain.   Eyes: Negative for pain.  Respiratory: Negative for shortness of breath.   Cardiovascular: Negative for chest pain.       Objective:   Physical Exam  Constitutional: Vital signs are normal. He appears well-developed and well-nourished.  HENT:  Head: Normocephalic.  Right Ear: Hearing normal.  Left Ear: Hearing normal.  Nose: Nose normal.  Mouth/Throat: Oropharynx is clear and moist and mucous membranes are normal.  Neck: Trachea normal. Carotid bruit is not present. No mass and no thyromegaly present.  Cardiovascular: Normal rate, regular rhythm and normal pulses.  Exam reveals no gallop, no distant heart sounds and no friction rub.   No murmur heard. No peripheral edema  Pulmonary/Chest: Effort normal and breath sounds normal. No respiratory distress.  Abdominal: Soft. Bowel sounds are normal. He exhibits no mass. There is no tenderness. There is no rebound and no guarding.  Skin: Skin is warm, dry and intact. No  rash noted.  Psychiatric: He has a normal mood and affect. His speech is normal and behavior is normal. Thought content normal.          Assessment & Plan:  Abnormal EKG at ER showed QT prolongation but here was normal. There was significant artifact that likely caused false reading.

## 2014-05-07 ENCOUNTER — Encounter: Payer: Self-pay | Admitting: Family Medicine

## 2014-05-08 ENCOUNTER — Other Ambulatory Visit: Payer: Self-pay | Admitting: Family Medicine

## 2014-05-09 ENCOUNTER — Other Ambulatory Visit: Payer: Self-pay | Admitting: Family Medicine

## 2014-10-05 ENCOUNTER — Other Ambulatory Visit: Payer: Self-pay | Admitting: Family Medicine

## 2014-10-05 NOTE — Telephone Encounter (Signed)
Last office visit 04/19/2014 with Dr. Ermalene SearingBedsole.  Last refilled 03/25/2014 for #30 with 5 refills.  Ok to refill?

## 2014-10-06 NOTE — Telephone Encounter (Signed)
Ok to refill 30,  5 ref 

## 2014-12-02 ENCOUNTER — Ambulatory Visit (INDEPENDENT_AMBULATORY_CARE_PROVIDER_SITE_OTHER): Payer: 59 | Admitting: Family Medicine

## 2014-12-02 ENCOUNTER — Encounter: Payer: Self-pay | Admitting: *Deleted

## 2014-12-02 ENCOUNTER — Encounter: Payer: Self-pay | Admitting: Family Medicine

## 2014-12-02 ENCOUNTER — Telehealth: Payer: Self-pay

## 2014-12-02 VITALS — BP 124/80 | HR 74 | Temp 98.3°F | Wt 233.2 lb

## 2014-12-02 DIAGNOSIS — F411 Generalized anxiety disorder: Secondary | ICD-10-CM

## 2014-12-02 DIAGNOSIS — T43205A Adverse effect of unspecified antidepressants, initial encounter: Secondary | ICD-10-CM | POA: Insufficient documentation

## 2014-12-02 MED ORDER — VENLAFAXINE HCL ER 37.5 MG PO CP24: 37.5000 mg | ORAL_CAPSULE | Freq: Every day | ORAL | Status: DC

## 2014-12-02 NOTE — Telephone Encounter (Signed)
Pt s wife said pt stopped venlafaxine on 11/26/14 and stopped alprazolam this past weekend,; pt has nausea, dizziness and is very anxious; Mrs Dimple CaseyRice thinks pt is in withdrawal for stopping meds; No DPR signed and pts wife request appt today. Last appt available today at 2:15 and appt scheduled with Dr Sharen HonesGutierrez. Mrs Dimple CaseyRice will drive pt to appt.

## 2014-12-02 NOTE — Telephone Encounter (Signed)
Seen today. 

## 2014-12-02 NOTE — Patient Instructions (Addendum)
These symptoms are due to stopping venlafaxine suddenly.  Decrease dose to 37.5mg  XR capsules daily for next month. Then may try taking every other day for 2 weeks then may stop. If dizzy symptoms recur with every other day dosing, call me - you may need tablets you can cut. Monitor symptoms off venlafaxine and let us know if questions or concerns.

## 2014-12-02 NOTE — Progress Notes (Signed)
Pre visit review using our clinic review tool, if applicable. No additional management support is needed unless otherwise documented below in the visit note. 

## 2014-12-02 NOTE — Progress Notes (Signed)
   BP 124/80 mmHg  Pulse 74  Temp(Src) 98.3 F (36.8 C) (Oral)  Wt 233 lb 4 oz (105.802 kg)   CC: dizziness/anxiety  Subjective:    Patient ID: Erik [name], male    DOB: 11/30/1961, 53 y.o.   MRN: [id number]  HPI: Erik [name] is a 53 y.o. male presenting on 12/02/2014 for Dizziness   Presents with wife today.  Patient with anxiety prior on effexor 75mg  once daily and alprazolam 0.5mg  24 hour daily. Was on these medications for several years. 1 week ago stopped xanax. Continued taking venlafaxine XR. Felt well. Then Friday was last dose of venlafaxine XR. Yesterday and today feeling more dizzy, nauseated, paranoid, crazy dreams.   Over last 6 months has also noted that when he missed a dose of meds (rarely) he would feel dizzy.   Prior has tried zoloft and celexa which may have helped.  Relevant past medical, surgical, family and social history reviewed and updated as indicated. Interim medical history since our last visit reviewed. Allergies and medications reviewed and updated.  Current Outpatient Prescriptions on File Prior to Visit  Medication Sig  . fluticasone (FLONASE) 50 MCG/ACT nasal spray Place 2 sprays into both nostrils daily as needed.  . loratadine (CLARITIN) 10 MG tablet Take 10 mg by mouth daily as needed.   Marland Kitchen. omeprazole (PRILOSEC) 20 MG capsule TAKE ONE CAPSULE BY MOUTH DAILY  . simvastatin (ZOCOR) 40 MG tablet TAKE ONE TABLET BY MOUTH EVERY NIGHT AT BEDTIME   No current facility-administered medications on file prior to visit.    Review of Systems Per HPI unless specifically indicated above     Objective:    BP 124/80 mmHg  Pulse 74  Temp(Src) 98.3 F (36.8 C) (Oral)  Wt 233 lb 4 oz (105.802 kg)  Wt Readings from Last 3 Encounters:  12/02/14 233 lb 4 oz (105.802 kg)  04/19/14 235 lb (106.595 kg)  03/25/14 236 lb (107.049 kg)    Physical Exam  Constitutional: He appears well-developed and well-nourished. No distress.  HENT:  Right Ear: Hearing,  tympanic membrane, external ear and ear canal normal.  Left Ear: Hearing, tympanic membrane, external ear and ear canal normal.  Mouth/Throat: Oropharynx is clear and moist. No oropharyngeal exudate.  Cardiovascular: Normal rate, regular rhythm, normal heart sounds and intact distal pulses.   No murmur heard. Pulmonary/Chest: Effort normal and breath sounds normal. No respiratory distress. He has no wheezes. He has no rales.  Musculoskeletal: He exhibits no edema.  Skin: Skin is warm and dry. No rash noted.  Psychiatric: He has a normal mood and affect.  Nursing note and vitals reviewed.      Assessment & Plan:   Problem List Items Addressed This Visit    Antidepressant discontinuation syndrome - Primary    Has done well coming of alprazolam and was on very low dose prior - now 1 wk off benzo, minimal concern for withdrawal off benzo at this point. Anticipate all sxs stemming from venlafaxine discontinuation. Reviewed with patient. Rec effexor XR 37.5mg  daily for next 3 weeks then take QOD dosing for 2 weeks then stop. Pt will monitor mood off meds and he and wife will decide if they think continuing antidepressant will be beneficial.        Follow up plan: Return as needed.

## 2014-12-02 NOTE — Assessment & Plan Note (Addendum)
Has done well coming of alprazolam and was on very low dose prior - now 1 wk off benzo, minimal concern for withdrawal off benzo at this point. Anticipate all sxs stemming from venlafaxine discontinuation. Reviewed with patient. Rec effexor XR 37.5mg  daily for next 3 weeks then take QOD dosing for 2 weeks then stop. Pt will monitor mood off meds and he and wife will decide if they think continuing antidepressant will be beneficial.

## 2014-12-05 ENCOUNTER — Encounter: Payer: Self-pay | Admitting: *Deleted

## 2014-12-05 ENCOUNTER — Ambulatory Visit (INDEPENDENT_AMBULATORY_CARE_PROVIDER_SITE_OTHER): Payer: 59 | Admitting: Family Medicine

## 2014-12-05 ENCOUNTER — Encounter: Payer: Self-pay | Admitting: Family Medicine

## 2014-12-05 VITALS — BP 136/82 | HR 84 | Temp 98.5°F | Wt 233.0 lb

## 2014-12-05 DIAGNOSIS — T43205D Adverse effect of unspecified antidepressants, subsequent encounter: Secondary | ICD-10-CM

## 2014-12-05 DIAGNOSIS — F411 Generalized anxiety disorder: Secondary | ICD-10-CM

## 2014-12-05 DIAGNOSIS — R202 Paresthesia of skin: Secondary | ICD-10-CM

## 2014-12-05 DIAGNOSIS — R42 Dizziness and giddiness: Secondary | ICD-10-CM

## 2014-12-05 DIAGNOSIS — K219 Gastro-esophageal reflux disease without esophagitis: Secondary | ICD-10-CM

## 2014-12-05 MED ORDER — OMEPRAZOLE 40 MG PO CPDR: 40.0000 mg | DELAYED_RELEASE_CAPSULE | Freq: Every day | ORAL | Status: DC

## 2014-12-05 NOTE — Progress Notes (Signed)
Pre visit review using our clinic review tool, if applicable. No additional management support is needed unless otherwise documented below in the visit note. 

## 2014-12-05 NOTE — Assessment & Plan Note (Signed)
Increase omeprazole to 40mg  daily for 3 wks then back down to 20mg  dose

## 2014-12-05 NOTE — Patient Instructions (Addendum)
Blood work today. Even slower taper of venlafaxine XR - take 75mg  alternating with 37.5mg  for 2 weeks then decrease to 37.5mg  once daily. Then continue taper as discussed previously.  Good to see you today. Out of work for rest of week.

## 2014-12-05 NOTE — Progress Notes (Signed)
BP 136/82 mmHg  Pulse 84  Temp(Src) 98.5 F (36.9 C) (Oral)  Wt 233 lb (105.688 kg)   CC: persistent dizziness  Subjective:    Patient ID: Erik [name], male    DOB: 07-Aug-1961, 53 y.o.   MRN: [id number]  HPI: Erik [name] is a 53 y.o. male presenting on 12/05/2014 for Follow-up   See prior note for details. Seen here Monday with dx venlafaxine discontinuation syndrome. Restarted effexor xr 37.5mg  once daily. Feeling some better but persistent dizziness/nausea.  Today endorses some dizziness ongoing since prior to stopping meds. Endorses 6 mo h/o mild dizzy flares whenever standing up quickly, noticing some trouble with short term memory, some mild nausea and some headache behind eyes. Dizziness described as mild vertigo, no presyncope or lightheadedness or imbalance. Vertigo worse with sudden head movements. No hearing changes or tinnitus. Nausea unaffected by food/diet. Occasional finger paresthesias when hands get cold. + fatigue and inebriated feeling.   No fevers/chills, vomiting, weight stable. Appetite good. No palpitations. No h/o migraines. No congestion or sinus pressure or recent viral infections. No arm weakness or recent falls. No shaking, anxiety, tremors or seizures.   Also endorses worsening GERD despite daily omeprazole 20mg .   Relevant past medical, surgical, family and social history reviewed and updated as indicated. Interim medical history since our last visit reviewed. Allergies and medications reviewed and updated. Current Outpatient Prescriptions on File Prior to Visit  Medication Sig  . omeprazole (PRILOSEC) 20 MG capsule TAKE ONE CAPSULE BY MOUTH DAILY  . simvastatin (ZOCOR) 40 MG tablet TAKE ONE TABLET BY MOUTH EVERY NIGHT AT BEDTIME  . venlafaxine XR (EFFEXOR XR) 37.5 MG 24 hr capsule Take 1 capsule (37.5 mg total) by mouth daily with breakfast.  . fluticasone (FLONASE) 50 MCG/ACT nasal spray Place 2 sprays into both nostrils daily as needed.  . loratadine  (CLARITIN) 10 MG tablet Take 10 mg by mouth daily as needed.    No current facility-administered medications on file prior to visit.   Past Medical History  Diagnosis Date  . HYPERLIPIDEMIA 07/07/2009    Qualifier: Diagnosis of  By: Patsy Lageropland MD, Karleen HampshireSpencer    . ANXIETY 06/04/2009    Qualifier: Diagnosis of  By: Patsy Lageropland MD, Karleen HampshireSpencer    . DEPRESSION 06/04/2009    Qualifier: Diagnosis of  By: Clydene PughWoodard CMA (AAMA), Heather    . ALLERGIC RHINITIS 06/04/2009    Qualifier: Diagnosis of  By: Patsy Lageropland MD, Karleen HampshireSpencer    . GERD 06/04/2009    Qualifier: Diagnosis of  By: Clydene PughWoodard CMA (AAMA), Heather    . Sleep apnea     uses cpap    Past Surgical History  Procedure Laterality Date  . Orif elbow fracture  1988    right  . Upper gastrointestinal endoscopy  1999    dilatation for stricture   Review of Systems Per HPI unless specifically indicated above     Objective:    BP 136/82 mmHg  Pulse 84  Temp(Src) 98.5 F (36.9 C) (Oral)  Wt 233 lb (105.688 kg)  Wt Readings from Last 3 Encounters:  12/05/14 233 lb (105.688 kg)  12/02/14 233 lb 4 oz (105.802 kg)  04/19/14 235 lb (106.595 kg)    Physical Exam  Constitutional: He is oriented to person, place, and time. He appears well-developed and well-nourished. No distress.  HENT:  Right Ear: Hearing, tympanic membrane, external ear and ear canal normal.  Left Ear: Hearing, tympanic membrane, external ear and ear canal normal.  Nose:  No mucosal edema or rhinorrhea.  Mouth/Throat: Uvula is midline, oropharynx is clear and moist and mucous membranes are normal. No oropharyngeal exudate, posterior oropharyngeal edema, posterior oropharyngeal erythema or tonsillar abscesses.  Eyes: Conjunctivae and EOM are normal. Pupils are equal, round, and reactive to light.  Neck: Normal range of motion. Neck supple. Carotid bruit is not present. No thyromegaly present.  Cardiovascular: Normal rate, regular rhythm, normal heart sounds and intact distal pulses.   No murmur  heard. Pulmonary/Chest: Effort normal and breath sounds normal. No respiratory distress. He has no wheezes. He has no rales.  Musculoskeletal: He exhibits no edema.  Lymphadenopathy:    He has no cervical adenopathy.  Neurological: He is alert and oriented to person, place, and time. He has normal strength. No cranial nerve deficit or sensory deficit. He displays a negative Romberg sign. Coordination and gait normal.  CN 2-12 intact dix-hallpike overall neg bilaterally, just mild dizziness.  Skin: Skin is dry. No rash noted.  Psychiatric: He has a normal mood and affect.  Nursing note and vitals reviewed.      Assessment & Plan:   Problem List Items Addressed This Visit    GERD    Increase omeprazole to 40mg  daily for 3 wks then back down to 20mg  dose    Relevant Medications      omeprazole (PRILOSEC) capsule   Generalized anxiety disorder    May eventually need SSRI. Discussed cross taper, pt prefers to come off effexor completely first.    Antidepressant discontinuation syndrome - Primary    Persistent dizziness description consistent with vertigo but neurological exam normal today. Unclear why persistently nauseated/dizzy but pt endorses this is more longstanding. ?persistent trouble with discontinuation syndrome - will slow taper - advised he use effexor XR 75mg  alternating with 37.5mg  daily for 2 wks then decrease to 37.5mg  daily. For dizziness- check TSH, CBC, BMP and for endorsed mild paresthesias check B12. Pt agrees with plan.     Other Visit Diagnoses    Dizziness        Relevant Orders       TSH       CBC with Differential       Basic metabolic panel    Paresthesias        Relevant Orders       Vitamin B12        Follow up plan: Return as needed.

## 2014-12-05 NOTE — Assessment & Plan Note (Addendum)
Persistent dizziness description consistent with vertigo but neurological exam normal today. Unclear why persistently nauseated/dizzy but pt endorses this is more longstanding. ?persistent trouble with discontinuation syndrome - will slow taper - advised he use effexor XR 75mg  alternating with 37.5mg  daily for 2 wks then decrease to 37.5mg  daily. For dizziness- check TSH, CBC, BMP and for endorsed mild paresthesias check B12. Pt agrees with plan.

## 2014-12-05 NOTE — Assessment & Plan Note (Signed)
May eventually need SSRI. Discussed cross taper, pt prefers to come off effexor completely first.

## 2014-12-06 LAB — CBC WITH DIFFERENTIAL/PLATELET
Basophils Absolute: 0 10*3/uL (ref 0.0–0.1)
Basophils Relative: 0.5 % (ref 0.0–3.0)
Eosinophils Absolute: 0.2 10*3/uL (ref 0.0–0.7)
Eosinophils Relative: 2 % (ref 0.0–5.0)
HCT: 46.3 % (ref 39.0–52.0)
Hemoglobin: 15.3 g/dL (ref 13.0–17.0)
Lymphocytes Relative: 36 % (ref 12.0–46.0)
Lymphs Abs: 3 10*3/uL (ref 0.7–4.0)
MCHC: 33 g/dL (ref 30.0–36.0)
MCV: 89.6 fl (ref 78.0–100.0)
Monocytes Absolute: 0.6 10*3/uL (ref 0.1–1.0)
Monocytes Relative: 7.2 % (ref 3.0–12.0)
Neutro Abs: 4.5 10*3/uL (ref 1.4–7.7)
Neutrophils Relative %: 54.3 % (ref 43.0–77.0)
Platelets: 264 10*3/uL (ref 150.0–400.0)
RBC: 5.17 Mil/uL (ref 4.22–5.81)
RDW: 13 % (ref 11.5–15.5)
WBC: 8.4 10*3/uL (ref 4.0–10.5)

## 2014-12-06 LAB — BASIC METABOLIC PANEL
BUN: 19 mg/dL (ref 6–23)
CO2: 27 mEq/L (ref 19–32)
Calcium: 9.2 mg/dL (ref 8.4–10.5)
Chloride: 101 mEq/L (ref 96–112)
Creatinine, Ser: 1 mg/dL (ref 0.4–1.5)
GFR: 80.39 mL/min (ref >60.00)
Glucose, Bld: 73 mg/dL (ref 70–99)
Potassium: 4.2 mEq/L (ref 3.5–5.1)
Sodium: 137 mEq/L (ref 135–145)

## 2014-12-09 ENCOUNTER — Other Ambulatory Visit: Payer: Self-pay | Admitting: Family Medicine

## 2014-12-09 ENCOUNTER — Telehealth: Payer: Self-pay

## 2014-12-09 NOTE — Telephone Encounter (Signed)
Please let them know that there is a lab equipment error, so there is a little bit of a delay. The main lab will be sending them out to one of the big lab companies like [facility] or [facility]. We will let you know when they are back.

## 2014-12-09 NOTE — Telephone Encounter (Signed)
Dare notified as instructed by telephone.

## 2014-12-09 NOTE — Telephone Encounter (Signed)
Mrs Erik [name] left v/m requesting recent lab results; listed under lab tab as collected; spoke with Terri and [facility]. Do not see DPR signed to talk with wife. Dr Reece AgarG out of office this week.Please advise.

## 2014-12-09 NOTE — Telephone Encounter (Signed)
[facility] system was done Friday, they are running all tests today

## 2014-12-09 NOTE — Telephone Encounter (Signed)
Please check - none of these labs are back? Very unusual.

## 2014-12-10 ENCOUNTER — Telehealth: Payer: Self-pay | Admitting: Family Medicine

## 2014-12-10 LAB — VITAMIN B12: Vitamin B-12: 467 pg/mL (ref 211–911)

## 2014-12-10 LAB — TSH: TSH: 2.415 u[IU]/mL (ref 0.350–4.500)

## 2014-12-10 NOTE — Telephone Encounter (Signed)
I am covering for Dr. Reece AgarG. I hope he is feeling better.   Please let them know that blood count, blood sugar, kidney function are all 100% normal. (No way causing any problems)  The TSH and B12 are still pending  CBC:    Component Value Date/Time   WBC 8.4 12/05/2014 1604   HGB 15.3 12/05/2014 1604   HCT 46.3 12/05/2014 1604   PLT 264.0 12/05/2014 1604   MCV 89.6 12/05/2014 1604   NEUTROABS 4.5 12/05/2014 1604   LYMPHSABS 3.0 12/05/2014 1604   MONOABS 0.6 12/05/2014 1604   EOSABS 0.2 12/05/2014 1604   BASOSABS 0.0 12/05/2014 1604    Basic Metabolic Panel:    Component Value Date/Time   NA 137 12/05/2014 1604   K 4.2 12/05/2014 1604   CL 101 12/05/2014 1604   CO2 27 12/05/2014 1604   BUN 19 12/05/2014 1604   CREATININE 1.0 12/05/2014 1604   GLUCOSE 73 12/05/2014 1604   CALCIUM 9.2 12/05/2014 1604    Electronically Signed  By: Hannah BeatSpencer Azzan Butler, MD On: 12/10/2014 9:02 AM

## 2014-12-10 NOTE — Telephone Encounter (Signed)
Damian notified as instructed by telephone.

## 2015-01-13 ENCOUNTER — Encounter: Payer: Self-pay | Admitting: Family Medicine

## 2015-01-13 ENCOUNTER — Ambulatory Visit (INDEPENDENT_AMBULATORY_CARE_PROVIDER_SITE_OTHER): Payer: 59 | Admitting: Family Medicine

## 2015-01-13 VITALS — BP 140/82 | HR 82 | Temp 98.2°F | Ht 70.25 in | Wt 235.5 lb

## 2015-01-13 DIAGNOSIS — R42 Dizziness and giddiness: Secondary | ICD-10-CM

## 2015-01-13 DIAGNOSIS — R202 Paresthesia of skin: Secondary | ICD-10-CM

## 2015-01-13 DIAGNOSIS — R269 Unspecified abnormalities of gait and mobility: Secondary | ICD-10-CM

## 2015-01-13 DIAGNOSIS — R2990 Unspecified symptoms and signs involving the nervous system: Secondary | ICD-10-CM

## 2015-01-13 DIAGNOSIS — F411 Generalized anxiety disorder: Secondary | ICD-10-CM

## 2015-01-13 MED ORDER — FLUOXETINE HCL 10 MG PO CAPS: 10.0000 mg | ORAL_CAPSULE | Freq: Every day | ORAL | Status: DC

## 2015-01-13 NOTE — Progress Notes (Signed)
Pre visit review using our clinic review tool, if applicable. No additional management support is needed unless otherwise documented below in the visit note. 

## 2015-01-13 NOTE — Progress Notes (Signed)
Dr. Karleen Hampshire T. Devlin Brink, MD, CAQ [facility] [address] [place], [address] Phone: [phone] Fax: [phone]  01/13/2015  Patient: Erik [name], MRN: [id number], DOB: 02-25-1961, 54 y.o.  Primary Physician:  [name], MD  Chief Complaint: Dizziness and Nausea  Subjective:   Erik [name] is a 54 y.o. very pleasant male patient who presents with the following:  The patient is here today accompanied by his wife with multiple symptoms.  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  He is having severe vertigo with almost any movement, but this is intermittent and has been ongoing now for approximately one year.  He also is having some nausea status, and his eyes are twitching, meaning that his actual eyeball is moving unusually compared to his baseline.  He also in general does not feel well at all, he is sick, nauseous, he feels unsteady with movement and with walking.  He also appears more anxious and endorses some anxiety, and he was discontinued relatively quickly last month for a taper off of venlafaxine.  He saw my partner about 1 month ago, and he altered his taper to make it somewhat more conservative.  11/2014, Dr. Reece Agar twice and dx with antidepressant discontinuation syndrome 04/18/2014, ER, Chest pain rule out 04/19/2014, saw Dr. Ermalene Searing, ? Gastroenteritis, nausea, pale, sweaty, acute.  Dizzy when moving eyes, getting hot and cold flashes. More and more dizzy spells that started in November of 2014. Then May it got bad.   Really - interfering with work. Feels sick, nauseous. Feels dizzy and unsteady. Feels like he is getting the spins (from alcohol).  No new drugs or over the counter drugs.   Old recent studies and lab work has been normal. 04/17/2014 CMP, cardiac enzymes, CBC all normal.  Has not taken any Xanax for a while. Took one Saturday. Did not help.  Cannot heel-shin with  eyes clothes.  Past Medical History, Surgical History, Social History, Family History, Problem List, Medications, and Allergies have been reviewed and updated if relevant.  Patient Active Problem List   Diagnosis Date Noted  . 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 NASAL POLYP 07/06/2010  . HYPERLIPIDEMIA 07/07/2009  . VENEREAL WART 06/04/2009  . Generalized anxiety disorder 06/04/2009  . DEPRESSION 06/04/2009  . ALLERGIC RHINITIS 06/04/2009  . GERD 06/04/2009    Past Medical History  Diagnosis Date  . HYPERLIPIDEMIA 07/07/2009    Qualifier: Diagnosis of  By: Patsy Lager MD, Karleen Hampshire    . ANXIETY 06/04/2009    Qualifier: Diagnosis of  By: Patsy Lager MD, Karleen Hampshire    . DEPRESSION 06/04/2009    Qualifier: Diagnosis of  By: Clydene Pugh CMA (AAMA), Heather    . ALLERGIC RHINITIS 06/04/2009    Qualifier: Diagnosis of  By: Patsy Lager MD, Karleen Hampshire    . GERD 06/04/2009    Qualifier: Diagnosis of  By: Clydene Pugh CMA (AAMA), Heather    . Sleep apnea     uses cpap    Past Surgical History  Procedure Laterality Date  . Orif elbow fracture  1988    right  . Upper gastrointestinal endoscopy  1999    dilatation for stricture    History   Social History  . Marital Status: Married    Spouse Name: N/A    Number of Children: N/A  . Years of Education: N/A   Occupational History  . Not on file.   Social History Main  Topics  . Smoking status: Former Smoker    Types: Cigarettes    Quit date: 08/24/1992  . Smokeless tobacco: Never Used  . Alcohol Use: 0.0 oz/week    0 Not specified per week     Comment: average 12 beers on weekend  . Drug Use: No  . Sexual Activity: Not on file   Other Topics Concern  . Not on file   Social History Narrative    Family History  Problem Relation Age of Onset  . Colon cancer Neg Hx   . Stomach cancer Neg Hx   . Esophageal cancer Neg Hx   . Rectal cancer Neg Hx     No Known Allergies  Medication list reviewed and updated in full in [facility].   GEN: No acute illnesses, no fevers, chills. GI: as above Pulm: No SOB Otherwise, the pertinent positives and negatives are listed above and in the HPI, otherwise a full review of systems has been reviewed and is negative unless noted positive.   Objective:   BP 140/82 mmHg  Pulse 82  Temp(Src) 98.2 F (36.8 C) (Oral)  Ht 5' 10.25" (1.784 m)  Wt 235 lb 8 oz (106.822 kg)  BMI 33.56 kg/m2  GEN: WDWN, NAD, Non-toxic, A & O x 3 HEENT: Atraumatic, Normocephalic. Neck supple. No masses, No LAD. Ears and Nose: No external deformity. CV: RRR, No M/G/R. No JVD. No thrill. No extra heart sounds. PULM: CTA B, no wheezes, crackles, rhonchi. No retractions. No resp. distress. No accessory muscle use. EXTR: No c/c/e NEURO Normal gait.  PSYCH: Normally interactive. Conversant. Appears anxious.  Neuro: CN 2-12 grossly intact. PERRLA. EOMI - INDUCES IMMEDIATE VERTIGO. Sensation intact throughout. Str 5/5 all extremities. DTR 2+. No clonus. A and o x 4. Romberg UNSTEADY COMPARED TO BASELINE. Finger nose neg. Heel -shin POS. UNABLE TO WALK HEEL TO TOE WITHOUT FALLING. WORSE WITH CLOSED EYES.    Laboratory and Imaging Data: Recent Results (from the past 2160 hour(s))  CBC with Differential     Status: None   Collection Time: 12/05/14  4:04 PM  Result Value Ref Range   WBC 8.4 4.0 - 10.5 K/uL   RBC 5.17 4.22 - 5.81 Mil/uL   Hemoglobin 15.3 13.0 - 17.0 g/dL   HCT 96.246.3 95.239.0 - 84.152.0 %   MCV 89.6 78.0 - 100.0 fl   MCHC 33.0 30.0 - 36.0 g/dL   RDW 32.413.0 40.111.5 - 02.715.5 %   Platelets 264.0 150.0 - 400.0 K/uL   Neutrophils Relative % 54.3 43.0 - 77.0 %   Lymphocytes Relative 36.0 12.0 - 46.0 %   Monocytes Relative 7.2 3.0 - 12.0 %   Eosinophils Relative 2.0 0.0 - 5.0 %   Basophils Relative 0.5 0.0 - 3.0 %   Neutro Abs 4.5 1.4 - 7.7 K/uL   Lymphs Abs 3.0 0.7 - 4.0 K/uL   Monocytes Absolute 0.6 0.1 - 1.0 K/uL   Eosinophils Absolute 0.2 0.0 - 0.7 K/uL   Basophils Absolute 0.0 0.0 - 0.1 K/uL    Basic metabolic panel     Status: None   Collection Time: 12/05/14  4:04 PM  Result Value Ref Range   Sodium 137 135 - 145 mEq/L   Potassium 4.2 3.5 - 5.1 mEq/L   Chloride 101 96 - 112 mEq/L   CO2 27 19 - 32 mEq/L   Glucose, Bld 73 70 - 99 mg/dL   BUN 19 6 - 23 mg/dL   Creatinine, Ser 1.0  0.4 - 1.5 mg/dL   Calcium 9.2 8.4 - 16.1 mg/dL   GFR 09.60 >45.40 mL/min  TSH     Status: None   Collection Time: 12/09/14 12:00 AM  Result Value Ref Range   TSH 2.415 0.350 - 4.500 uIU/mL  Vitamin B12     Status: None   Collection Time: 12/09/14 12:00 AM  Result Value Ref Range   Vitamin B-12 467 211 - 911 pg/mL     Assessment and Plan:   Vertigo - Plan: MR Brain W Wo Contrast, Ambulatory referral to Neurology, Ambulatory referral to ENT  Abnormal neurological exam - Plan: MR Brain W Wo Contrast, Ambulatory referral to Neurology, Ambulatory referral to ENT  Tingling in extremities - Plan: MR Brain W Wo Contrast, Ambulatory referral to Neurology, Ambulatory referral to ENT  Gait disturbance - Plan: MR Brain W Wo Contrast, Ambulatory referral to Neurology, Ambulatory referral to ENT  Generalized anxiety disorder  >40 minutes spent in face to face time with patient, >50% spent in counselling or coordination of care  Decidedly altered and different compared to baseline.  Chronic, worsening vertigo and balance disturbance ongoing for greater than 1 year.  Tingling and paresthesias in extremities without decreased sensation, but the patient has an altered gait, markedly altered neurological testing, unable to perform heel-to-toe walking, unsteady Romberg testing.  Timing does not completely go along with antidepressant discontinuation syndrome.  Nevertheless, will start him on 10 mg of Prozac.  Obtain an MRI of the brain with and without contrast to evaluate for potential neoplasm, demyelinating disease, cranial nerve VIII tumor, or other intracranial process causing these changes.  Consult  neurology.  Consult ENT.  It is possible that the vertigo may be a separate process and inner ear driven.  Appreciate all of their help.  The patient drives a commercial vehicle.  I told the patient and his wife that he is not to drive either a commercial vehicle or a regular vehicle until this is sorted out.  They indicated understanding.  Note given.  New Prescriptions   FLUOXETINE (PROZAC) 10 MG CAPSULE    Take 1 capsule (10 mg total) by mouth daily.   Orders Placed This Encounter  Procedures  . MR Brain W Wo Contrast  . Ambulatory referral to Neurology  . Ambulatory referral to ENT    Signed,  Karleen Hampshire T. Demetrious Rainford, MD   Patient's Medications  New Prescriptions   FLUOXETINE (PROZAC) 10 MG CAPSULE    Take 1 capsule (10 mg total) by mouth daily.  Previous Medications   FLUTICASONE (FLONASE) 50 MCG/ACT NASAL SPRAY    Place 2 sprays into both nostrils daily as needed.   LORATADINE (CLARITIN) 10 MG TABLET    Take 10 mg by mouth daily as needed.    OMEPRAZOLE (PRILOSEC) 20 MG CAPSULE    TAKE ONE CAPSULE BY MOUTH DAILY   SIMVASTATIN (ZOCOR) 40 MG TABLET    TAKE ONE TABLET BY MOUTH EVERY NIGHT AT BEDTIME  Modified Medications   No medications on file  Discontinued Medications   OMEPRAZOLE (PRILOSEC) 40 MG CAPSULE    Take 1 capsule (40 mg total) by mouth daily. Short course of higher dose   VENLAFAXINE XR (EFFEXOR XR) 37.5 MG 24 HR CAPSULE    Take 1 capsule (37.5 mg total) by mouth daily with breakfast.

## 2015-01-13 NOTE — Patient Instructions (Signed)

## 2015-01-14 ENCOUNTER — Ambulatory Visit (INDEPENDENT_AMBULATORY_CARE_PROVIDER_SITE_OTHER): Payer: 59 | Admitting: Neurology

## 2015-01-14 ENCOUNTER — Encounter: Payer: Self-pay | Admitting: Neurology

## 2015-01-14 VITALS — BP 112/82 | HR 68 | Resp 16 | Ht 71.75 in | Wt 232.0 lb

## 2015-01-14 DIAGNOSIS — R51 Headache: Secondary | ICD-10-CM

## 2015-01-14 DIAGNOSIS — R519 Headache, unspecified: Secondary | ICD-10-CM

## 2015-01-14 DIAGNOSIS — R42 Dizziness and giddiness: Secondary | ICD-10-CM

## 2015-01-14 NOTE — Progress Notes (Signed)
NEUROLOGY CONSULTATION NOTE  [name]: [id number] DOB: [name]  Referring provider: Dr. Hannah [name] Primary care provider: Dr. Hannah [name]  Reason for consult:  dizziness  Dear Dr Erik [name]:  Thank you for your kind referral of Erik [name] for consultation of the above symptoms. He is accompanied by his wife who helps supplement this history. Although his history is well known to you, please allow me to reiterate it for the purpose of our medical record. Records and images were personally reviewed where available.  HISTORY OF PRESENT ILLNESS: This is 54 year old right-handed man with a history of anxiety, hyperlipidemia, presenting for evaluation of worsening dizziness. He describes the dizziness as a sensation of movement, "like when you had too much to drink." Symptoms were occurring intermittently since November 2014, however he reports that since May 2015, symptoms worsened and became constant event if he was completely still. They would be provoked even more by any type of movement. 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 He would move his eyes and feel nauseated. He has noticed difficulty walking, feeling off balance like he would fall, no falls. He would get clammy when they occur, with a sensation of heat coming from his body. Over the past few months, he has been having intermittent mild headaches lasting a few minutes, with no associated photo/phonophobia, no visual obscurations. He reports waking up with a headache this morning. He denies any diplopia, dysarthria, dysphagia, tinnitus, hearing loss, neck pain, bowel/bladder dysfunction. He denies any congestion but reports having a hard time with sinus issues using his CPAP machine. He has had tingling in both hands, worse when cold, but states this has not occurred in the past few weeks. He feels his whole body is weak. He  denies prior similar symptoms in the past. No family history of similar symptoms. He denies any recent head injuries. On his last PCP visit, he was noted to have vertigo provoked by eye movements, unable to walk heel to toe, +Romberg test.  Laboratory Data: Lab Results  Component Value Date   TSH 2.415 12/09/2014   Lab Results  Component Value Date   VITAMINB12 467 12/09/2014   Lab Results  Component Value Date   WBC 8.4 12/05/2014   HGB 15.3 12/05/2014   HCT 46.3 12/05/2014   MCV 89.6 12/05/2014   PLT 264.0 12/05/2014     Chemistry      Component Value Date/Time   NA 137 12/05/2014 1604   K 4.2 12/05/2014 1604   CL 101 12/05/2014 1604   CO2 27 12/05/2014 1604   BUN 19 12/05/2014 1604   CREATININE 1.0 12/05/2014 1604      Component Value Date/Time   CALCIUM 9.2 12/05/2014 1604   ALKPHOS 43 03/25/2014 0905   AST 18 03/25/2014 0905   ALT 34 03/25/2014 0905   BILITOT 0.4 03/25/2014 0905      PAST MEDICAL HISTORY: Past Medical History  Diagnosis Date  . HYPERLIPIDEMIA 07/07/2009    Qualifier: Diagnosis of  By: Erik Lager MD, Erik [name]    . ANXIETY 06/04/2009    Qualifier: Diagnosis of  By: Erik Lager MD, Erik [name]    . DEPRESSION 06/04/2009    Qualifier: Diagnosis of  By: Erik [name] CMA (AAMA), Erik [name]    . ALLERGIC RHINITIS 06/04/2009    Qualifier: Diagnosis of  By: Erik Lager MD, Erik [name]    . GERD 06/04/2009    Qualifier: Diagnosis of  By: Erik PughWoodard CMA (AAMA), Erik [name]    . Sleep apnea     uses cpap    PAST SURGICAL HISTORY: Past Surgical History  Procedure Laterality Date  . Orif elbow fracture  1988    right  . Upper gastrointestinal endoscopy  1999    dilatation for stricture    MEDICATIONS: Current Outpatient Prescriptions on File Prior to Visit  Medication Sig Dispense Refill  . FLUoxetine (PROZAC) 10 MG capsule Take 1 capsule (10 mg total) by mouth daily. 30 capsule 3  . fluticasone (FLONASE) 50 MCG/ACT nasal spray Place 2 sprays into both nostrils daily as needed.      . loratadine (CLARITIN) 10 MG tablet Take 10 mg by mouth daily as needed.     Marland Kitchen. omeprazole (PRILOSEC) 20 MG capsule TAKE ONE CAPSULE BY MOUTH DAILY 30 capsule 11  . simvastatin (ZOCOR) 40 MG tablet TAKE ONE TABLET BY MOUTH EVERY NIGHT AT BEDTIME 30 tablet 11   No current facility-administered medications on file prior to visit.    ALLERGIES: No Known Allergies  FAMILY HISTORY: Family History  Problem Relation Age of Onset  . Colon cancer Neg Hx   . Stomach cancer Neg Hx   . Esophageal cancer Neg Hx   . Rectal cancer Neg Hx     SOCIAL HISTORY: History   Social History  . Marital Status: Married    Spouse Name: N/A    Number of Children: N/A  . Years of Education: N/A   Occupational History  . Not on file.   Social History Main Topics  . Smoking status: Former Smoker    Types: Cigarettes    Quit date: 08/24/1992  . Smokeless tobacco: Never Used  . Alcohol Use: 0.0 oz/week    0 Not specified per week     Comment: average 12 beers on weekend  . Drug Use: No  . Sexual Activity: Not on file   Other Topics Concern  . Not on file   Social History Narrative    REVIEW OF SYSTEMS: Constitutional: No fevers, chills, or sweats, no generalized fatigue, change in appetite Eyes: No visual changes, double vision, eye pain Ear, nose and throat: No hearing loss, ear pain, nasal congestion, sore throat Cardiovascular: No chest pain, palpitations Respiratory:  No shortness of breath at rest or with exertion, wheezes GastrointestinaI: No nausea, vomiting, diarrhea, abdominal pain, fecal incontinence Genitourinary:  No dysuria, urinary retention or frequency Musculoskeletal:  No neck pain, +back pain Integumentary: No rash, pruritus, skin lesions Neurological: as above Psychiatric: No depression, insomnia,+ anxiety Endocrine: No palpitations, fatigue, diaphoresis, mood swings, change in appetite, change in weight, increased thirst Hematologic/Lymphatic:  No anemia, purpura,  petechiae. Allergic/Immunologic: no itchy/runny eyes, nasal congestion, recent allergic reactions, rashes  PHYSICAL EXAM: Filed Vitals:   01/14/15 1018  BP: 112/82  Pulse: 68  Resp: 16   General: No acute distress Head:  Normocephalic/atraumatic Eyes: Fundoscopic exam shows bilateral sharp discs, no vessel changes, exudates, or hemorrhages Neck: supple, no paraspinal tenderness, full range of motion Back: No paraspinal tenderness Heart: regular rate and rhythm Lungs: Clear to auscultation bilaterally. Vascular: No carotid bruits. Skin/Extremities: No rash, no edema Neurological Exam: Mental status: alert and oriented to person, place, and time, no dysarthria or aphasia, Fund of knowledge is appropriate.  Recent and remote memory are intact.  Attention and concentration are normal.    Able to name objects and repeat phrases. Cranial nerves: CN I: not tested CN II: pupils equal, round and reactive to  light, visual fields intact, fundi unremarkable. CN III, IV, VI:  full range of motion, no nystagmus, no ptosis CN V: facial sensation intact CN VII: upper and lower face symmetric CN VIII: hearing intact to finger rub CN IX, X: gag intact, uvula midline CN XI: sternocleidomastoid and trapezius muscles intact CN XII: tongue midline Bulk & Tone: normal, no fasciculations. Motor: 5/5 throughout with no pronator drift. Sensation: decreased vibration to both ankles. Otherwise intact to light touch, cold, pin, and joint position sense.  No extinction to double simultaneous stimulation.  Romberg test positive sway Deep Tendon Reflexes: asymmetric reflexes +3 brisk on left UE and LE, +2 on right UE and LE, no ankle clonus Plantar responses: downgoing bilaterally Cerebellar: no incoordination on finger to nose, heel to shin. No dysdiadochokinesia Gait: slow and cautious, difficulty with tandem walk but able Tremor: negative  IMPRESSION: This is a 54 year old right-handed man with a history  of hyperlipidemia and anxiety, presenting with an 76-month history of constant dizziness worsened by head/eye movements and recent headaches. His exam shows asymmetric reflexes on the left side, and mild length-dependent neuropathy. We discussed different etiologies of dysequilibrium. He is scheduled for an MRI brain in the next few days to assess for underlying structural abnormality. We discussed that if MRI brain is normal, he will be referred for vestibular therapy for gaze stabilization exercises. He does have mild peripheral neuropathy, which can also cause dysequilibrium, we will continue to monitor this. Anxiety may be playing a role as well, he is interested in seeing Behavioral Health for continued treatment. He will follow-up in 1 month.   Thank you for allowing me to participate in the care of this patient. Please do not hesitate to call for any questions or concerns.   Erik [name], M.D.  CC: Dr. Patsy [name]

## 2015-01-14 NOTE — Patient Instructions (Signed)
1. Proceed with MRI brain with and without contrast as scheduled 2. If MRI is normal, we will plan for referral to Vestibular therapy

## 2015-01-21 ENCOUNTER — Ambulatory Visit
Admission: RE | Admit: 2015-01-21 | Discharge: 2015-01-21 | Disposition: A | Discharge status: Home / Self Care | Payer: 59 | Source: Ambulatory Visit | Attending: Family Medicine | Admitting: Family Medicine

## 2015-01-21 DIAGNOSIS — R202 Paresthesia of skin: Secondary | ICD-10-CM

## 2015-01-21 DIAGNOSIS — R42 Dizziness and giddiness: Secondary | ICD-10-CM

## 2015-01-21 DIAGNOSIS — R269 Unspecified abnormalities of gait and mobility: Secondary | ICD-10-CM

## 2015-01-21 DIAGNOSIS — R2990 Unspecified symptoms and signs involving the nervous system: Secondary | ICD-10-CM

## 2015-01-21 MED ORDER — GADOBENATE DIMEGLUMINE 529 MG/ML IV SOLN
20.0000 mL | Freq: Once | INTRAVENOUS | Status: AC | PRN
  Administered 2015-01-21: 20 mL via INTRAVENOUS

## 2015-01-23 ENCOUNTER — Telehealth: Payer: Self-pay | Admitting: *Deleted

## 2015-01-23 ENCOUNTER — Encounter: Payer: Self-pay | Admitting: *Deleted

## 2015-01-23 NOTE — Telephone Encounter (Signed)
I spoke with him he wanted to know if you had a chance to look over his MRI.

## 2015-01-23 NOTE — Telephone Encounter (Signed)
Pls let him know I reviewed MRI brain and it is normal, no evidence of tumor, stroke, bleed, or aneurysm. Would proceed with vestibular therapy as discussed. If he wants to proceed with Behavioral Health, pls send referral. Thanks

## 2015-01-23 NOTE — Telephone Encounter (Signed)
Please review

## 2015-01-23 NOTE — Telephone Encounter (Signed)
Called patient. Notified MRI was normal. [facility] He has some other appts coming up & he also wants to discuss it with his wife. I told him to just let us know if he wanted referral to be made.

## 2015-01-23 NOTE — Telephone Encounter (Signed)
Patient calling in reference to his most recent MRI

## 2015-01-23 NOTE — Telephone Encounter (Signed)
Pt wants to have you call this phone number [phone]

## 2015-02-13 ENCOUNTER — Telehealth: Payer: Self-pay | Admitting: *Deleted

## 2015-02-13 NOTE — Telephone Encounter (Signed)
Patient canceled his follow up appointment 

## 2015-02-21 ENCOUNTER — Ambulatory Visit: Payer: 59 | Admitting: Neurology

## 2015-04-15 ENCOUNTER — Other Ambulatory Visit: Payer: Self-pay | Admitting: Family Medicine

## 2015-04-15 NOTE — Telephone Encounter (Signed)
Please call and schedule CPE with fasting labs prior with Dr. Patsy [place]ager[place] sometime in the next three months.

## 2015-05-13 ENCOUNTER — Other Ambulatory Visit: Payer: Self-pay | Admitting: Family Medicine

## 2015-05-14 ENCOUNTER — Ambulatory Visit (INDEPENDENT_AMBULATORY_CARE_PROVIDER_SITE_OTHER): Payer: 59 | Admitting: Family Medicine

## 2015-05-14 ENCOUNTER — Ambulatory Visit (INDEPENDENT_AMBULATORY_CARE_PROVIDER_SITE_OTHER)
Admission: RE | Admit: 2015-05-14 | Discharge: 2015-05-14 | Disposition: A | Discharge status: Home / Self Care | Payer: 59 | Source: Ambulatory Visit | Attending: Family Medicine | Admitting: Family Medicine

## 2015-05-14 ENCOUNTER — Encounter: Payer: Self-pay | Admitting: Family Medicine

## 2015-05-14 VITALS — BP 144/90 | HR 88 | Temp 98.4°F | Ht 71.75 in | Wt 233.2 lb

## 2015-05-14 DIAGNOSIS — M25521 Pain in right elbow: Secondary | ICD-10-CM

## 2015-05-14 DIAGNOSIS — S42401S Unspecified fracture of lower end of right humerus, sequela: Secondary | ICD-10-CM | POA: Diagnosis not present

## 2015-05-14 NOTE — Progress Notes (Signed)
Pre visit review using our clinic review tool, if applicable. No additional management support is needed unless otherwise documented below in the visit note. 

## 2015-05-14 NOTE — Progress Notes (Signed)
Dr. Karleen Hampshire T. Bobetta Korf, MD, [facility] [address] [place], [address] Phone: [phone] Fax: [phone]  05/14/2015  Patient: Erik [name], MRN: [id number], DOB: [birth year], 54 y.o.  Primary Physician:  [name], MD  Chief Complaint: Elbow Pain and Pain in Joints  Subjective:   Erik [name] is a 54 y.o. very pleasant male patient who presents with the following:  The patient reports that he had a complex elbow fracture in the 1980s, but he is unsure what bone he fractured, and he is also unsure of the type of surgery that he had to fix this.  Nevertheless, he had a complicated surgery per his report, and he has continued to have problems with his elbow since then.  He has had a loss of terminal motion with a loss of extension for decades, and he has had continued pain and decreased strength in virtually all movements of the elbow, but this seems to be worsening over time, and particularly this is worsened over the last 3 months and he is having pain at baseline now in the last 3 months.  He has had no recent trauma at all.  Late 1980's R elbow surgery. ORIF. Hardware is present - he thinks.  3 months  Using it all the time, rested and not improved.    Past Medical History, Surgical History, Social History, Family History, Problem List, Medications, and Allergies have been reviewed and updated if relevant.  GEN: No fevers, chills. Nontoxic. Primarily MSK c/o today. MSK: Detailed in the HPI GI: tolerating PO intake without difficulty Neuro: No numbness, parasthesias, or tingling associated. Otherwise the pertinent positives of the ROS are noted above.   Objective:   BP 144/90 mmHg  Pulse 88  Temp(Src) 98.4 F (36.9 C) (Oral)  Ht 5' 11.75" (1.822 m)  Wt 233 lb 4 oz (105.802 kg)  BMI 31.87 kg/m2   GEN: WDWN, NAD, Non-toxic, Alert & Oriented x 3 HEENT: Atraumatic, Normocephalic.  Ears and Nose: No external  deformity. EXTR: No clubbing/cyanosis/edema NEURO: Normal gait.  PSYCH: Normally interactive. Conversant. Not depressed or anxious appearing.  Calm demeanor.    Right elbow: No bruising or swelling.  The patient lacks approximately 11 of extension.  He also has a decrease in his terminal pronation as well as supination.  Flexion is preserved.  Flexion, extension, pronation, and supination at the elbow has 4/5 strength compared to 5 out of 5 on the left. Grip is dramatically decreased compared to the left.  He is relatively minorly tender on most of his elbow but really not specifically in one area.  Radiology: Dg Elbow Complete Right  05/15/2015   CLINICAL DATA:  Right elbow pain for 3 months, no known injury  EXAM: RIGHT ELBOW - COMPLETE 3+ VIEW  COMPARISON:  None.  FINDINGS: Four views of the right elbow submitted. There is mild displaced fracture of the radial head. This may represent subacute or old fracture with nonunion. Clinical correlation is necessary.  IMPRESSION: Mild displaced fracture of radial head. This may represent subacute or old fracture with nonunion. Clinical correlation is necessary. No posterior fat pad sign.   Electronically Signed   By: Natasha Mead M.D.   On: 05/15/2015 08:17    Assessment and Plan:   Right elbow pain - Plan: DG Elbow Complete Right, Ambulatory referral to Orthopedic Surgery  Elbow fracture, right, sequela - Plan: Ambulatory referral to Orthopedic Surgery  Complex case.  Dr.  Pop called me on the phone to discuss.  History is not consistent with acute fracture, but likely this represents his old elbow trauma and fracture distantly.  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  His pain has worsened and he is clinically doing worse, and he baseline has decreased strength in essentially all movements of the elbow and has notable loss of motion.  I am not sure what to offer this gentleman.  I think that he would be best suited by  discussing his case with one of our expert hand and elbow surgeons in the region.  We are going to have him follow-up with Dr. Melvyn Novasrtmann for consultation.  New Prescriptions   No medications on file   Orders Placed This Encounter  Procedures  . DG Elbow Complete Right  . Ambulatory referral to Orthopedic Surgery    Signed,  Karleen HampshireSpencer T. Daniil Labarge, MD   Patient's Medications  New Prescriptions   No medications on file  Previous Medications   FLUOXETINE (PROZAC) 10 MG CAPSULE    TAKE ONE CAPSULE BY MOUTH DAILY   FLUTICASONE (FLONASE) 50 MCG/ACT NASAL SPRAY    Place 2 sprays into both nostrils daily as needed.   LORATADINE (CLARITIN) 10 MG TABLET    Take 10 mg by mouth daily as needed.    OMEPRAZOLE (PRILOSEC) 20 MG CAPSULE    TAKE ONE CAPSULE BY MOUTH DAILY   SIMVASTATIN (ZOCOR) 40 MG TABLET    TAKE ONE TABLET BY MOUTH EVERY NIGHT AT BEDTIME  Modified Medications   No medications on file  Discontinued Medications   No medications on file

## 2015-05-22 ENCOUNTER — Telehealth: Payer: Self-pay | Admitting: Family Medicine

## 2015-05-22 NOTE — Telephone Encounter (Signed)
[facility] Client TELEPHONE ADVICE RECORD [facility]  Patient Name: Erik [name]  Gender: Male  DOB: 1961/05/03   Age: [age] Y 1 M 19 D  Return Phone Number: [phone]) [phone] (Primary)  Address:   City/State/Zip: [place]    Client [facility]  Client Site [facility]  Physician Copland, Spencer   Contact Type Call  Call Type Triage / Clinical  Relationship To Patient Self  Info pasted into Epic Yes  Return Phone Number [phone] (Primary)  Chief Complaint Headache  Initial Comment Caller states c/o headache with pressure behind eyes, wants to make an appt   Nurse Assessment      Guidelines      Guideline Title Affirmed Question Affirmed Notes Nurse Date/Time (Eastern Time)         Disp. Time Lamount Cohen(Eastern Time) Disposition Final User   05/22/2015 8:51:06 AM Send To Clinical Follow Up Darrick PennaQueue  Green, Amy   05/22/2015 8:54:30 AM Attempt made - message left  Arnold LongMaples, RN, Trish   05/22/2015 9:14:18 AM Attempt made - no message left  Arnold LongMaples, RN, Rosann Auerbachrish    05/22/2015 9:55:22 AM FINAL ATTEMPT MADE - message left Yes Arnold LongMaples, RN, Trish       After Care Instructions Given     Call Event Type User Date / Time Description

## 2015-05-22 NOTE — Telephone Encounter (Signed)
Pt has appt with Dr Ermalene SearingBedsole on 05/23/15 at 9:15 AM.

## 2015-05-23 ENCOUNTER — Encounter: Payer: Self-pay | Admitting: Family Medicine

## 2015-05-23 ENCOUNTER — Ambulatory Visit (INDEPENDENT_AMBULATORY_CARE_PROVIDER_SITE_OTHER): Payer: 59 | Admitting: Family Medicine

## 2015-05-23 VITALS — BP 140/84 | HR 64 | Temp 98.3°F | Ht 71.75 in | Wt 231.5 lb

## 2015-05-23 DIAGNOSIS — R51 Headache: Secondary | ICD-10-CM | POA: Diagnosis not present

## 2015-05-23 DIAGNOSIS — F411 Generalized anxiety disorder: Secondary | ICD-10-CM | POA: Diagnosis not present

## 2015-05-23 DIAGNOSIS — R030 Elevated blood-pressure reading, without diagnosis of hypertension: Secondary | ICD-10-CM | POA: Diagnosis not present

## 2015-05-23 DIAGNOSIS — R519 Headache, unspecified: Secondary | ICD-10-CM | POA: Insufficient documentation

## 2015-05-23 MED ORDER — SERTRALINE HCL 50 MG PO TABS: 50.0000 mg | ORAL_TABLET | Freq: Every day | ORAL | Status: DC

## 2015-05-23 NOTE — Progress Notes (Signed)
Pre visit review using our clinic review tool, if applicable. No additional management support is needed unless otherwise documented below in the visit note. 

## 2015-05-23 NOTE — Patient Instructions (Addendum)
Stop fluoxetine.  Start zoloft 50 mg daily x 1 week, if tolerating then increase to 100 mg.  Follow up up in  4 weeks with PCP for mood re-eval. Follow blood pressure at home.Marland Kitchen. Keep record and bring to next appointment. Work on stress reduction and relaxation.

## 2015-05-23 NOTE — Progress Notes (Signed)
   Subjective:    Patient ID: Erik [name], male    DOB: 11/12/1961, 54 y.o.   MRN: [id number]  HPI  54 year old male pt of Dr. Cyndie Chimeopland's with history of generalized anxiety disorder presents to discuss medication management.  Several months ago he came off of venlafaxine.. Had vertigo and significant  SE from coming off the medication. 12/2014.  Started on low 10 mg fluoxetine.  At that time no clear benefit on 10 mg .   He reports that he has noted in last few days he has had a headache. 2-3/10- on pain scale. He feels very anxious, feels like things are closing in, getting worse in last several weeks. He is very irritable and has anger management issues.  Anxiety has never been very well controlled on fluoxetine.  Mood was much better  on venlafaxine.  No SI, no HI. Sleeping well.  Counselor has not helped  In past.  Constant headache. Behind eyes bilaterally. No known triggers... Felt may be due to eyeglass changes.  Has had nml MRI in last year, 01/21/2015. No fall or head injury. No family or personal history of headache/migraine. No allergy symptoms BP Readings from Last 3 Encounters:  05/23/15 140/84  05/14/15 144/90  01/14/15 112/82     He has no side effects and better benefit from zoloft in past.  Review of Systems  Constitutional: Negative for fever and fatigue.  HENT: Negative for ear pain.   Eyes: Negative for pain.  Respiratory: Negative for shortness of breath.   Cardiovascular: Negative for chest pain.       Objective:   Physical Exam  Constitutional: Vital signs are normal. He appears well-developed and well-nourished.  HENT:  Head: Normocephalic.  Right Ear: Hearing normal.  Left Ear: Hearing normal.  Nose: Nose normal.  Mouth/Throat: Oropharynx is clear and moist and mucous membranes are normal.  Neck: Trachea normal. Carotid bruit is not present. No thyroid mass and no thyromegaly present.  Cardiovascular: Normal rate, regular rhythm and normal  pulses.  Exam reveals no gallop, no distant heart sounds and no friction rub.   No murmur heard. No peripheral edema  Pulmonary/Chest: Effort normal and breath sounds normal. No respiratory distress.  Neurological: He has normal strength. No cranial nerve deficit or sensory deficit. Gait normal.  No papilledema  Skin: Skin is warm, dry and intact. No rash noted.  Psychiatric: He has a normal mood and affect. His speech is normal and behavior is normal. Thought content normal.          Assessment & Plan:

## 2015-05-23 NOTE — Addendum Note (Signed)
Addended by: Damita LackLORING, Lovinia Snare S on: 05/23/2015 10:33 AM   Modules accepted: Orders, Medications

## 2015-05-23 NOTE — Assessment & Plan Note (Signed)
Follow at home. May need medication to treat. Follow up with PCP. Encouraged exercise, weight loss, healthy eating habits.

## 2015-05-23 NOTE — Assessment & Plan Note (Signed)
?   Secondary to med vs BP elevation vs other. No red flags, nml neuro exam.

## 2015-05-23 NOTE — Assessment & Plan Note (Signed)
?   SE and ineffectiveness of fluoxetine. Will cahnge to 50 mg sertraline and increase to 100 mg if tolerating.

## 2015-06-30 ENCOUNTER — Ambulatory Visit: Payer: 59 | Admitting: Family Medicine

## 2015-07-09 ENCOUNTER — Other Ambulatory Visit: Payer: Self-pay | Admitting: Family Medicine

## 2015-08-04 ENCOUNTER — Telehealth: Payer: Self-pay | Admitting: Family Medicine

## 2015-08-04 NOTE — Telephone Encounter (Signed)
Please call and schedule CPX with fasting labs prior with Dr. Patsy Lager.

## 2015-08-04 NOTE — Telephone Encounter (Signed)
Last office visit 05/23/2015 with Dr. Ermalene Searing.  Last Lipid Panel 03/25/2014.  Ok to refill?

## 2015-08-04 NOTE — Telephone Encounter (Signed)
Ok, 30, 3 ref or 90, 0 ref  F/u cpx

## 2015-08-05 NOTE — Telephone Encounter (Signed)
cpx 9/19 pt needed to do labs same day due to work schedule Please close

## 2015-09-08 ENCOUNTER — Ambulatory Visit (INDEPENDENT_AMBULATORY_CARE_PROVIDER_SITE_OTHER): Payer: 59 | Admitting: Family Medicine

## 2015-09-08 VITALS — BP 130/60 | HR 61 | Temp 98.2°F | Ht 70.0 in | Wt 233.5 lb

## 2015-09-08 DIAGNOSIS — E785 Hyperlipidemia, unspecified: Secondary | ICD-10-CM | POA: Diagnosis not present

## 2015-09-08 DIAGNOSIS — Z0000 Encounter for general adult medical examination without abnormal findings: Secondary | ICD-10-CM | POA: Diagnosis not present

## 2015-09-08 DIAGNOSIS — Z79899 Other long term (current) drug therapy: Secondary | ICD-10-CM

## 2015-09-08 DIAGNOSIS — Z125 Encounter for screening for malignant neoplasm of prostate: Secondary | ICD-10-CM

## 2015-09-08 NOTE — Progress Notes (Signed)
Pre visit review using our clinic review tool, if applicable. No additional management support is needed unless otherwise documented below in the visit note. 

## 2015-09-08 NOTE — Progress Notes (Signed)
Dr. Frederico Hamman T. Junice Fei, MD, [facility] [place], [address] Phone: [phone] Fax: [phone]  09/08/2015  Patient: Erik [name], MRN: [id number], DOB: [birth year], 54 y.o.  Primary Physician:  Owens Loffler, MD  Chief Complaint: Annual Exam  Subjective:   Erik [name] is a 54 y.o. pleasant patient who presents with the following:  Preventative Health Maintenance Visit:  Health Maintenance Summary Reviewed and updated, unless pt declines services.  Tobacco History Reviewed. Alcohol: No concerns, no excessive use Exercise Habits: rare activity, rec at least 30 mins 5 times a week STD concerns: no risk or activity to increase risk Drug Use: None Encouraged self-testicular check  Some shoulder pain.  No exercise  Does not have time to ride motorcycle.  Mood doing ok.   Health Maintenance  Topic Date Due  . INFLUENZA VACCINE  09/07/2016 (Originally 07/21/2015)  . COLONOSCOPY  10/09/2022  . TETANUS/TDAP  03/25/2024  . Hepatitis C Screening  Completed  . HIV Screening  Completed   Immunization History  Administered Date(s) Administered  . Tdap 03/25/2014   Patient Active Problem List   Diagnosis Date Noted  . Elevated blood pressure reading without diagnosis of hypertension 05/23/2015  . 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 NASAL POLYP 07/06/2010  . HYPERLIPIDEMIA 07/07/2009  . VENEREAL WART 06/04/2009  . Generalized anxiety disorder 06/04/2009  . DEPRESSION 06/04/2009  . ALLERGIC RHINITIS 06/04/2009  . GERD 06/04/2009   Past Medical History  Diagnosis Date  . HYPERLIPIDEMIA 07/07/2009    Qualifier: Diagnosis of  By: Lorelei Pont MD, Frederico Hamman    . ANXIETY 06/04/2009    Qualifier: Diagnosis of  By: Lorelei Pont MD, Frederico Hamman    . DEPRESSION 06/04/2009    Qualifier: Diagnosis of  By: Ellin Mayhew CMA ([place]), Heather    . ALLERGIC RHINITIS 06/04/2009    Qualifier: Diagnosis of  By: Lorelei Pont MD, Frederico Hamman     . GERD 06/04/2009    Qualifier: Diagnosis of  By: Ellin Mayhew CMA ([place]), Heather    . Sleep apnea     uses cpap   Past Surgical History  Procedure Laterality Date  . Orif elbow fracture  1988    right  . Upper gastrointestinal endoscopy  1999    dilatation for stricture   Social History   Social History  . Marital Status: Married    Spouse Name: N/A  . Number of Children: N/A  . Years of Education: N/A   Occupational History  . Not on file.   Social History Main Topics  . Smoking status: Former Smoker    Types: Cigarettes    Quit date: 08/24/1992  . Smokeless tobacco: Never Used  . Alcohol Use: 0.0 oz/week    0 Standard drinks or equivalent per week     Comment: average 12 beers on weekend  . Drug Use: No  . Sexual Activity: Not on file   Other Topics Concern  . Not on file   Social History Narrative   Family History  Problem Relation Age of Onset  . Colon cancer Neg Hx   . Stomach cancer Neg Hx   . Esophageal cancer Neg Hx   . Rectal cancer Neg Hx    No Known Allergies  Medication list has been reviewed and updated.   General: Denies fever, chills, sweats. No significant weight loss. Eyes: Denies blurring,significant itching ENT: Denies earache, sore throat, and hoarseness. Cardiovascular: Denies chest pains, palpitations, dyspnea on exertion Respiratory: Denies cough,  dyspnea at rest,wheeezing Breast: no concerns about lumps GI: Denies nausea, vomiting, diarrhea, constipation, change in bowel habits, abdominal pain, melena, hematochezia GU: Denies penile discharge, ED, urinary flow / outflow problems. No STD concerns. Musculoskeletal: SOME SHOULDER AND NECK PAIN Derm: Denies rash, itching Neuro: Denies  paresthesias, frequent falls, frequent headaches Psych: Denies depression, anxiety Endocrine: Denies cold intolerance, heat intolerance, polydipsia Heme: Denies enlarged lymph nodes Allergy: No hayfever  Objective:   BP 130/60 mmHg  Pulse 61   Temp(Src) 98.2 F (36.8 C) (Oral)  Ht $R'5\' 10"'Kl$  (1.778 m)  Wt 233 lb 8 oz (105.915 kg)  BMI 33.50 kg/m2 Ideal Body Weight: Weight in (lb) to have BMI = 25: 173.9  No exam data present  GEN: well developed, well nourished, no acute distress Eyes: conjunctiva and lids normal, PERRLA, EOMI ENT: TM clear, nares clear, oral exam WNL Neck: supple, no lymphadenopathy, no thyromegaly, no JVD Pulm: clear to auscultation and percussion, respiratory effort normal CV: regular rate and rhythm, S1-S2, no murmur, rub or gallop, no bruits, peripheral pulses normal and symmetric, no cyanosis, clubbing, edema or varicosities GI: soft, non-tender; no hepatosplenomegaly, masses; active bowel sounds all quadrants GU: no hernia, testicular mass, penile discharge Lymph: no cervical, axillary or inguinal adenopathy MSK: gait normal, muscle tone and strength WNL, no joint swelling, effusions, discoloration, crepitus. NO IMPINGEMENT SKIN: clear, good turgor, color WNL, no rashes, lesions, or ulcerations Neuro: normal mental status, normal strength, sensation, and motion Psych: alert; oriented to person, place and time, normally interactive and not anxious or depressed in appearance. All labs reviewed with patient.  Lipids:    Component Value Date/Time   CHOL 202* 09/08/2015 1526   TRIG 185.0* 09/08/2015 1526   HDL 46.40 09/08/2015 1526   LDLDIRECT 140.9 08/24/2012 1659   VLDL 37.0 09/08/2015 1526   CHOLHDL 4 09/08/2015 1526   CBC: CBC Latest Ref Rng 09/08/2015 12/05/2014 04/17/2014  WBC 4.0 - 10.5 K/uL 8.6 8.4 14.7(H)  Hemoglobin 13.0 - 17.0 g/dL 14.6 15.3 15.5  Hematocrit 39.0 - 52.0 % 43.3 46.3 46.8  Platelets 150.0 - 400.0 K/uL 255.0 264.0 370    Basic Metabolic Panel:    Component Value Date/Time   NA 141 09/08/2015 1526   NA 135* 04/17/2014 2220   K 4.3 09/08/2015 1526   K 4.1 04/17/2014 2220   CL 103 09/08/2015 1526   CL 102 04/17/2014 2220   CO2 30 09/08/2015 1526   CO2 30 04/17/2014 2220    BUN 13 09/08/2015 1526   BUN 17 04/17/2014 2220   CREATININE 0.95 09/08/2015 1526   CREATININE 0.94 04/17/2014 2220   GLUCOSE 88 09/08/2015 1526   GLUCOSE 122* 04/17/2014 2220   CALCIUM 9.6 09/08/2015 1526   CALCIUM 9.2 04/17/2014 2220   Hepatic Function Latest Ref Rng 09/08/2015 04/17/2014 03/25/2014  Total Protein 6.0 - 8.3 g/dL 7.5 8.5(H) 7.5  Albumin 3.5 - 5.2 g/dL 4.9 4.8 4.6  AST 0 - 37 U/L $Remo'17 31 18  'hLRJV$ ALT 0 - 53 U/L 26 44 34  Alk Phosphatase 39 - 117 U/L 50 61 43  Total Bilirubin 0.2 - 1.2 mg/dL 0.4 0.6 0.4  Bilirubin, Direct 0.0 - 0.3 mg/dL 0.1 - 0.1    Lab Results  Component Value Date   TSH 2.415 12/09/2014   Lab Results  Component Value Date   PSA 0.50 09/08/2015   PSA 0.56 03/25/2014   PSA 0.51 08/24/2012    Assessment and Plan:   Healthcare maintenance  Screening PSA (prostate  specific antigen) - Plan: PSA  Hyperlipidemia - Plan: Lipid panel  Encounter for long-term current use of medication - Plan: Basic metabolic panel, CBC with Differential/Platelet, Hepatic function panel  Health Maintenance Exam: The patient's preventative maintenance and recommended screening tests for an annual wellness exam were reviewed in full today. Brought up to date unless services declined.  Counselled on the importance of diet, exercise, and its role in overall health and mortality. The patient's FH and SH was reviewed, including their home life, tobacco status, and drug and alcohol status.  Follow-up: No Follow-up on file. Unless noted, follow-up in 1 year for Health Maintenance Exam.  New Prescriptions   No medications on file   Orders Placed This Encounter  Procedures  . Basic metabolic panel  . CBC with Differential/Platelet  . Hepatic function panel  . Lipid panel  . PSA    Signed,  Frederico Hamman T. Janiyha Montufar, MD   Patient's Medications  New Prescriptions   No medications on file  Previous Medications   FLUTICASONE (FLONASE) 50 MCG/ACT NASAL SPRAY    Place 2  sprays into both nostrils daily as needed.   LORATADINE (CLARITIN) 10 MG TABLET    Take 10 mg by mouth daily as needed.    OMEPRAZOLE (PRILOSEC) 20 MG CAPSULE    TAKE ONE (1) CAPSULE BY MOUTH EACH DAY   SERTRALINE (ZOLOFT) 50 MG TABLET    Take 1 tablet (50 mg total) by mouth daily. If tolerating after one week, increase to two tablets daily.   SIMVASTATIN (ZOCOR) 40 MG TABLET    TAKE ONE (1) TABLET BY MOUTH EVERY NIGHTAT BEDTIME  Modified Medications   No medications on file  Discontinued Medications   No medications on file

## 2015-09-09 ENCOUNTER — Encounter: Payer: Self-pay | Admitting: Family Medicine

## 2015-09-09 LAB — CBC WITH DIFFERENTIAL/PLATELET
Basophils Absolute: 0 10*3/uL (ref 0.0–0.1)
Basophils Relative: 0.3 % (ref 0.0–3.0)
Eosinophils Absolute: 0.2 10*3/uL (ref 0.0–0.7)
Eosinophils Relative: 2.1 % (ref 0.0–5.0)
HCT: 43.3 % (ref 39.0–52.0)
Hemoglobin: 14.6 g/dL (ref 13.0–17.0)
Lymphocytes Relative: 31.6 % (ref 12.0–46.0)
Lymphs Abs: 2.7 10*3/uL (ref 0.7–4.0)
MCHC: 33.7 g/dL (ref 30.0–36.0)
MCV: 88.6 fl (ref 78.0–100.0)
Monocytes Absolute: 0.4 10*3/uL (ref 0.1–1.0)
Monocytes Relative: 4.3 % (ref 3.0–12.0)
Neutro Abs: 5.3 10*3/uL (ref 1.4–7.7)
Neutrophils Relative %: 61.7 % (ref 43.0–77.0)
Platelets: 255 10*3/uL (ref 150.0–400.0)
RBC: 4.88 Mil/uL (ref 4.22–5.81)
RDW: 12.9 % (ref 11.5–15.5)
WBC: 8.6 10*3/uL (ref 4.0–10.5)

## 2015-09-09 LAB — LIPID PANEL
Cholesterol: 202 mg/dL — ABNORMAL HIGH (ref 0–200)
HDL: 46.4 mg/dL (ref >39.00)
LDL Cholesterol: 119 mg/dL — ABNORMAL HIGH (ref 0–99)
NonHDL: 155.92
Total CHOL/HDL Ratio: 4
Triglycerides: 185 mg/dL — ABNORMAL HIGH (ref 0.0–149.0)
VLDL: 37 mg/dL (ref 0.0–40.0)

## 2015-09-09 LAB — BASIC METABOLIC PANEL
BUN: 13 mg/dL (ref 6–23)
CO2: 30 mEq/L (ref 19–32)
Calcium: 9.6 mg/dL (ref 8.4–10.5)
Chloride: 103 mEq/L (ref 96–112)
Creatinine, Ser: 0.95 mg/dL (ref 0.40–1.50)
GFR: 88 mL/min (ref >60.00)
Glucose, Bld: 88 mg/dL (ref 70–99)
Potassium: 4.3 mEq/L (ref 3.5–5.1)
Sodium: 141 mEq/L (ref 135–145)

## 2015-09-09 LAB — HEPATIC FUNCTION PANEL
ALT: 26 U/L (ref 0–53)
AST: 17 U/L (ref 0–37)
Albumin: 4.9 g/dL (ref 3.5–5.2)
Alkaline Phosphatase: 50 U/L (ref 39–117)
Bilirubin, Direct: 0.1 mg/dL (ref 0.0–0.3)
Total Bilirubin: 0.4 mg/dL (ref 0.2–1.2)
Total Protein: 7.5 g/dL (ref 6.0–8.3)

## 2015-09-09 LAB — PSA: PSA: 0.5 ng/mL (ref 0.10–4.00)

## 2015-09-13 ENCOUNTER — Other Ambulatory Visit: Payer: Self-pay | Admitting: Family Medicine

## 2015-11-14 ENCOUNTER — Other Ambulatory Visit: Payer: Self-pay | Admitting: Family Medicine

## 2016-02-16 ENCOUNTER — Ambulatory Visit (INDEPENDENT_AMBULATORY_CARE_PROVIDER_SITE_OTHER): Payer: 59 | Admitting: Family Medicine

## 2016-02-16 ENCOUNTER — Encounter: Payer: Self-pay | Admitting: Family Medicine

## 2016-02-16 ENCOUNTER — Ambulatory Visit (INDEPENDENT_AMBULATORY_CARE_PROVIDER_SITE_OTHER)
Admission: RE | Admit: 2016-02-16 | Discharge: 2016-02-16 | Disposition: A | Discharge status: Home / Self Care | Payer: 59 | Source: Ambulatory Visit | Attending: Family Medicine | Admitting: Family Medicine

## 2016-02-16 VITALS — BP 120/78 | HR 71 | Temp 98.2°F | Ht 70.0 in | Wt 225.5 lb

## 2016-02-16 DIAGNOSIS — M542 Cervicalgia: Secondary | ICD-10-CM

## 2016-02-16 DIAGNOSIS — M7711 Lateral epicondylitis, right elbow: Secondary | ICD-10-CM | POA: Diagnosis not present

## 2016-02-16 MED ORDER — PREDNISONE 20 MG PO TABS: ORAL_TABLET | ORAL | Status: DC

## 2016-02-16 NOTE — Progress Notes (Signed)
Dr. Karleen Hampshire T. Benyamin Jeff, MD, CAQ Sports Medicine Primary Care and Sports Medicine [address] [place], [address] Phone: [phone] Fax: [phone]  02/16/2016  Patient: Erik [name], MRN: [id number], DOB: 03/09/1961, 55 y.o.  Primary Physician:  [name], MD   Chief Complaint  Patient presents with  . Neck Pain    x 1 year  . Elbow Pain    Right   Subjective:   Erik [name] is a 55 y.o. very pleasant male patient who presents with the following:  Neck pain and R shoulder blade pain.  Has been to the chiropractor.  We have talked about this previously and his physical, and he has been having pain in his neck region in his right-sided shoulder blade for one year.  He is not having any neuropathic symptoms, no radicular symptoms, no weakness or numbness.  He has not had any injury or trauma.   He does not have any significant pain with abduction, internal range of motion or other impingement symptoms.  He has had his neck manipulated multiple times at the chiropractor without much significant relief of symptoms.  No numbness or tingling Muscle is really tight.   No films done yet.   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  Past Medical History, Surgical History, Social History, Family History, Problem List, Medications, and Allergies have been reviewed and updated if relevant.  Patient Active Problem List   Diagnosis Date Noted  . HYPERLIPIDEMIA 07/07/2009  . VENEREAL WART 06/04/2009  . Generalized anxiety disorder 06/04/2009  . DEPRESSION 06/04/2009  . ALLERGIC RHINITIS 06/04/2009  . GERD 06/04/2009    Past Medical History  Diagnosis Date  . HYPERLIPIDEMIA 07/07/2009    Qualifier: Diagnosis of  By: Patsy Lager MD, Karleen Hampshire    . ANXIETY 06/04/2009    Qualifier: Diagnosis of  By: Patsy Lager MD, Karleen Hampshire    . DEPRESSION 06/04/2009    Qualifier: Diagnosis of  By: Clydene Pugh CMA (AAMA), Heather     . ALLERGIC RHINITIS 06/04/2009    Qualifier: Diagnosis of  By: Patsy Lager MD, Karleen Hampshire    . GERD 06/04/2009    Qualifier: Diagnosis of  By: Clydene Pugh CMA (AAMA), Heather    . Sleep apnea     uses cpap    Past Surgical History  Procedure Laterality Date  . Orif elbow fracture  1988    right  . Upper gastrointestinal endoscopy  1999    dilatation for stricture    Social History   Social History  . Marital Status: Married    Spouse Name: N/A  . Number of Children: N/A  . Years of Education: N/A   Occupational History  . Not on file.   Social History Main Topics  . Smoking status: Former Smoker    Types: Cigarettes    Quit date: 08/24/1992  . Smokeless tobacco: Never Used  . Alcohol Use: 0.0 oz/week    0 Standard drinks or equivalent per week     Comment: average 12 beers on weekend  . Drug Use: No  . Sexual Activity: Not on file   Other Topics Concern  . Not on file   Social History Narrative    Family History  Problem Relation Age of Onset  . Colon cancer Neg Hx   . Stomach cancer Neg Hx   . Esophageal cancer Neg Hx   . Rectal cancer Neg Hx  No Known Allergies  Medication list reviewed and updated in full in [facility].  GEN: No fevers, chills. Nontoxic. Primarily MSK c/o today. MSK: Detailed in the HPI GI: tolerating PO intake without difficulty Neuro: No numbness, parasthesias, or tingling associated. Otherwise the pertinent positives of the ROS are noted above.   Objective:   BP 120/78 mmHg  Pulse 71  Temp(Src) 98.2 F (36.8 C) (Oral)  Ht 5\' 10"  (1.778 m)  Wt 225 lb 8 oz (102.286 kg)  BMI 32.36 kg/m2   GEN: Well-developed,well-nourished,in no acute distress; alert,appropriate and cooperative throughout examination HEENT: Normocephalic and atraumatic without obvious abnormalities. Ears, externally no deformities PULM: Breathing comfortably in no respiratory distress EXT: No clubbing, cyanosis, or edema PSYCH: Normally interactive.  Cooperative during the interview. Pleasant. Friendly and conversant. Not anxious or depressed appearing. Normal, full affect.  CERVICAL SPINE EXAM Range of motion: Flexion, extension, lateral bending, and rotation: mild restriction in all directions Pain with terminal motion: yes Spinous Processes: NT SCM: NT Upper paracervical muscles: more tender on the r Upper traps: r with some pain C5-T1 intact, sensation and motor   Shoulder: R Inspection: No muscle wasting or winging Ecchymosis/edema: neg  AC joint, scapula, clavicle: NT Abduction: full, 5/5 Flexion: full, 5/5 IR, full, lift-off: 5/5 ER at neutral: full, 5/5 AC crossover and compression: neg Neer: neg Hawkins: neg Drop Test: neg Empty Can: neg Supraspinatus insertion: NT Bicipital groove: NT Speed's: neg Yergason's: neg Sulcus sign: neg Scapular dyskinesis: none C5-T1 intact Sensation intact Grip 5/5   Elbow: R Ecchymosis or edema: neg ROM: full flexion, extension, pronation, supination Shoulder ROM: Full Flexion: 5/5 Extension: 5/5, PAINFUL Supination: 5/5, PAINFUL Pronation: 5/5 Wrist ext: 5/5 Wrist flexion: 5/5 No gross bony abnormality Varus and Valgus stress: stable ECRB tenderness: YES, TTP Medial epicondyle: NT Lateral epicondyle, resisted wrist extension from wrist full pronation and flexion: PAINFUL grip: 5/5  sensation intact Tinel's, Elbow: negative    Radiology: Dg Cervical Spine Complete  02/16/2016  CLINICAL DATA:  55 year old male with history of neck pain for 1 year. EXAM: CERVICAL SPINE - COMPLETE 4+ VIEW COMPARISON:  No priors. FINDINGS: There is no evidence of cervical spine fracture or prevertebral soft tissue swelling. Alignment is normal. No other significant bone abnormalities are identified. IMPRESSION: Negative cervical spine radiographs. Electronically Signed   By: Trudie Reed M.D.   On: 02/16/2016 15:05    Assessment and Plan:   Cervicalgia - Plan: DG Cervical Spine  Complete, Ambulatory referral to Physical Therapy  Right lateral epicondylitis - Plan: Ambulatory referral to Physical Therapy  Neck pain 1 year and lateral epicondylitis times more than a decade.  Plain films are reassuring.  Think it doing some neck rehabilitation would likely be of significant benefit, so we're going to send him for physical therapy.  Also think that some good tennis elbow rehabilitation and some Rashid technique would probably be helpful for his elbow, and certainly this would not be harmful given his long-standing history.  Will also try a burst of some prednisone to see if this helps  Follow-up: Return in about 6 weeks (around 03/29/2016).  New Prescriptions   PREDNISONE (DELTASONE) 20 MG TABLET    2 tabs po for 5 days, then 1 tab po for 5 days   Modified Medications   No medications on file   Orders Placed This Encounter  Procedures  . DG Cervical Spine Complete  . Ambulatory referral to Physical Therapy    Signed,  Karleen Hampshire T. Theressa Piedra,  MD   Patient's Medications  New Prescriptions   PREDNISONE (DELTASONE) 20 MG TABLET    2 tabs po for 5 days, then 1 tab po for 5 days  Previous Medications   FLUTICASONE (FLONASE) 50 MCG/ACT NASAL SPRAY    Place 2 sprays into both nostrils daily as needed.   LORATADINE (CLARITIN) 10 MG TABLET    Take 10 mg by mouth daily as needed.    OMEPRAZOLE (PRILOSEC) 20 MG CAPSULE    TAKE ONE CAPSULE BY MOUTH DAILY  Modified Medications   No medications on file  Discontinued Medications   SERTRALINE (ZOLOFT) 50 MG TABLET    Take 2 tablets (100 mg total) by mouth daily.   SIMVASTATIN (ZOCOR) 40 MG TABLET    TAKE ONE TABLET BY MOUTH EVERY NIGHT AT BEDTIME

## 2016-02-16 NOTE — Progress Notes (Signed)
Pre visit review using our clinic review tool, if applicable. No additional management support is needed unless otherwise documented below in the visit note. 

## 2016-03-02 ENCOUNTER — Other Ambulatory Visit: Payer: Self-pay | Admitting: *Deleted

## 2016-03-02 MED ORDER — FLUTICASONE PROPIONATE 50 MCG/ACT NA SUSP: 2.0000 | Freq: Every day | NASAL | Status: DC | PRN

## 2016-04-27 ENCOUNTER — Encounter: Payer: Self-pay | Admitting: *Deleted

## 2016-04-27 ENCOUNTER — Telehealth: Payer: Self-pay

## 2016-04-27 NOTE — Telephone Encounter (Signed)
Can you help:  Please include text.   Mr. Erik [name] is currently taking Sertraline at a dose of 100 mg daily, which is an appropriate and normal FDA approved dosage of this medication. On this dosage of this medication, the patient has been well-controlled and stable.

## 2016-04-27 NOTE — Telephone Encounter (Signed)
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 Pt request cb when done. Last annual exam on 09/08/15.

## 2016-04-27 NOTE — Telephone Encounter (Signed)
Letter written and faxed to Dr. Theda Belfasthoiniere at [facility] in [place] [phone].  Mr. Erik CaseyRice notified.

## 2016-05-20 ENCOUNTER — Other Ambulatory Visit: Payer: Self-pay | Admitting: Family Medicine

## 2016-05-20 NOTE — Telephone Encounter (Signed)
Most recently, he had stopped his medication.  More information is needed. Is everything ok?  He definitely should not start back his Sertraline at the old dose of 2 tabs if he has not been taking it.

## 2016-05-20 NOTE — Telephone Encounter (Signed)
Last office visit 02/16/2016.  Sertraline not on current medication list.  Refill?

## 2016-05-21 NOTE — Telephone Encounter (Signed)
Spoke with Constellation Brandsodd.  He states he has been back taking the Sertraline now for about 2 months.  He is currently taking the 50 mg two tablets by mouth daily.  Ok to refill?

## 2016-07-21 ENCOUNTER — Other Ambulatory Visit: Payer: Self-pay | Admitting: Family Medicine

## 2016-07-21 NOTE — Telephone Encounter (Signed)
Last office visit 02/16/2016.  Simvastatin is not on current medication list.  Refill?

## 2016-09-13 ENCOUNTER — Encounter: Payer: Self-pay | Admitting: Family Medicine

## 2016-09-13 ENCOUNTER — Ambulatory Visit (INDEPENDENT_AMBULATORY_CARE_PROVIDER_SITE_OTHER): Payer: 59 | Admitting: Family Medicine

## 2016-09-13 VITALS — BP 130/84 | HR 66 | Temp 98.3°F | Ht 70.0 in | Wt 236.5 lb

## 2016-09-13 DIAGNOSIS — F411 Generalized anxiety disorder: Secondary | ICD-10-CM

## 2016-09-13 MED ORDER — BUSPIRONE HCL 7.5 MG PO TABS: 7.5000 mg | ORAL_TABLET | Freq: Two times a day (BID) | ORAL | 2 refills | Status: DC

## 2016-09-13 NOTE — Progress Notes (Signed)
Pre visit review using our clinic review tool, if applicable. No additional management support is needed unless otherwise documented below in the visit note. 

## 2016-09-13 NOTE — Patient Instructions (Signed)

## 2016-09-13 NOTE — Progress Notes (Signed)
Dr. Karleen HampshireSpencer T. Camora Tremain, MD, CAQ Sports Medicine Primary Care and Sports Medicine [address] [place], [address] Phone: [phone] Fax: [phone]  09/13/2016  Patient: Eli Phillipsodd T Chesnut, MRN: [id number], DOB: August 29, 1961, 55 y.o.  Primary Physician:  Hannah BeatSpencer Macallan Ord, MD   Chief Complaint  Patient presents with  . Anxiety    Discuss Medication   Subjective:   Eli Phillipsodd T Broaden is a 55 y.o. very pleasant male patient who presents with the following:  Having some anxiety now on Zoloft 100 mg.  Feeling essentially all the time.  Can blow a gasket sometimes.   4 different SSRI's and 1 SNRI (Effexor)   Past Medical History, Surgical History, Social History, Family History, Problem List, Medications, and Allergies have been reviewed and updated if relevant.  Patient Active Problem List   Diagnosis Date Noted  . HYPERLIPIDEMIA 07/07/2009  . VENEREAL WART 06/04/2009  . Generalized anxiety disorder 06/04/2009  . DEPRESSION 06/04/2009  . ALLERGIC RHINITIS 06/04/2009  . GERD 06/04/2009    Past Medical History:  Diagnosis Date  . ALLERGIC RHINITIS 06/04/2009   Qualifier: Diagnosis of  By: Patsy Lageropland MD, Karleen HampshireSpencer    . ANXIETY 06/04/2009   Qualifier: Diagnosis of  By: Patsy Lageropland MD, Karleen HampshireSpencer    . DEPRESSION 06/04/2009   Qualifier: Diagnosis of  By: Clydene PughWoodard CMA (AAMA), Heather    . GERD 06/04/2009   Qualifier: Diagnosis of  By: Clydene PughWoodard CMA (AAMA), Heather    . HYPERLIPIDEMIA 07/07/2009   Qualifier: Diagnosis of  By: Patsy Lageropland MD, Karleen HampshireSpencer    . Sleep apnea    uses cpap    Past Surgical History:  Procedure Laterality Date  . ORIF ELBOW FRACTURE  1988   right  . UPPER GASTROINTESTINAL ENDOSCOPY  1999   dilatation for stricture    Social History   Social History  . Marital status: Married    Spouse name: N/A  . Number of children: N/A  . Years of education: N/A   Occupational History  . Not on file.   Social History Main Topics  . Smoking status: Former Smoker    Types:  Cigarettes    Quit date: 08/24/1992  . Smokeless tobacco: Never Used  . Alcohol use 0.0 oz/week     Comment: average 12 beers on weekend  . Drug use: No  . Sexual activity: Not on file   Other Topics Concern  . Not on file   Social History Narrative  . No narrative on file    Family History  Problem Relation Age of Onset  . Colon cancer Neg Hx   . Stomach cancer Neg Hx   . Esophageal cancer Neg Hx   . Rectal cancer Neg Hx     No Known Allergies  Medication list reviewed and updated in full in [facility].   GEN: No acute illnesses, no fevers, chills. GI: No n/v/d, eating normally Pulm: No SOB Interactive and getting along well at home.  Otherwise, ROS is as per the HPI.  Objective:   BP 130/84   Pulse 66   Temp 98.3 F (36.8 C) (Oral)   Ht 5\' 10"  (1.778 m)   Wt 236 lb 8 oz (107.3 kg)   BMI 33.93 kg/m   GEN: WDWN, NAD, Non-toxic, A & O x 3 HEENT: Atraumatic, Normocephalic. Neck supple. No masses, No LAD. Ears and Nose: No external deformity. EXTR: No c/c/e NEURO Normal gait.  PSYCH: Normally interactive. Conversant. Not depressed or anxious appearing.  Calm demeanor.  Laboratory and Imaging Data:  Assessment and Plan:   Generalized anxiety disorder - Plan: Ambulatory referral to Psychiatry  >25 minutes spent in face to face time with patient, >50% spent in counselling or coordination of care: decompensated anxiety now on Zoloft 100 mg. Has been on Celexa, Lexapro, Prozac, Effexor, and Xanax in the past additionally. Referred to psych in 2014, but he did not go. Now here with his wife, having some baseline anxiety without depression basically at all times. 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 Add Buspar, and he is amenable to psych consult and help.  Follow-up: psych  New Prescriptions   BUSPIRONE (BUSPAR) 7.5 MG TABLET    Take 1 tablet (7.5 mg total) by mouth 2 (two) times daily.   Orders Placed This Encounter  Procedures  .  Ambulatory referral to Psychiatry    Signed,  Karleen Hampshire T. Yajayra Feldt, MD   Patient's Medications  New Prescriptions   BUSPIRONE (BUSPAR) 7.5 MG TABLET    Take 1 tablet (7.5 mg total) by mouth 2 (two) times daily.  Previous Medications   FLUTICASONE (FLONASE) 50 MCG/ACT NASAL SPRAY    Place 2 sprays into both nostrils daily as needed.   LORATADINE (CLARITIN) 10 MG TABLET    Take 10 mg by mouth daily as needed.    OMEPRAZOLE (PRILOSEC) 20 MG CAPSULE    TAKE ONE CAPSULE BY MOUTH DAILY   SERTRALINE (ZOLOFT) 50 MG TABLET    TAKE TWO (2) TABLETS BY MOUTH DAILY   SIMVASTATIN (ZOCOR) 40 MG TABLET    TAKE ONE TABLET BY MOUTH AT BEDTIME  Modified Medications   No medications on file  Discontinued Medications   PREDNISONE (DELTASONE) 20 MG TABLET    2 tabs po for 5 days, then 1 tab po for 5 days

## 2016-11-01 ENCOUNTER — Encounter: Payer: Self-pay | Admitting: Psychiatry

## 2016-11-01 ENCOUNTER — Ambulatory Visit (INDEPENDENT_AMBULATORY_CARE_PROVIDER_SITE_OTHER): Payer: 59 | Admitting: Psychiatry

## 2016-11-01 VITALS — BP 138/86 | HR 74 | Ht 69.5 in | Wt 232.6 lb

## 2016-11-01 DIAGNOSIS — F331 Major depressive disorder, recurrent, moderate: Secondary | ICD-10-CM | POA: Diagnosis not present

## 2016-11-01 DIAGNOSIS — F411 Generalized anxiety disorder: Secondary | ICD-10-CM

## 2016-11-01 MED ORDER — SERTRALINE HCL 100 MG PO TABS: 150.0000 mg | ORAL_TABLET | Freq: Every day | ORAL | 2 refills | Status: DC
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
## 2016-11-17 ENCOUNTER — Other Ambulatory Visit: Payer: Self-pay | Admitting: Family Medicine

## 2016-11-29 ENCOUNTER — Ambulatory Visit (INDEPENDENT_AMBULATORY_CARE_PROVIDER_SITE_OTHER): Payer: 59 | Admitting: Psychiatry

## 2016-11-29 ENCOUNTER — Encounter: Payer: Self-pay | Admitting: Psychiatry

## 2016-11-29 VITALS — BP 133/85 | HR 71 | Temp 97.6°F | Wt 231.4 lb

## 2016-11-29 DIAGNOSIS — F331 Major depressive disorder, recurrent, moderate: Secondary | ICD-10-CM

## 2016-11-29 DIAGNOSIS — F411 Generalized anxiety disorder: Secondary | ICD-10-CM

## 2016-11-29 MED ORDER — SERTRALINE HCL 100 MG PO TABS: 150.0000 mg | ORAL_TABLET | Freq: Every day | ORAL | 2 refills | Status: DC
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
## 2017-01-13 ENCOUNTER — Other Ambulatory Visit: Payer: Self-pay | Admitting: Family Medicine

## 2017-01-24 ENCOUNTER — Telehealth: Payer: Self-pay

## 2017-01-24 NOTE — Telephone Encounter (Signed)
left message on doctor's line for the zoloft 100mg  for one addtional refill

## 2017-01-24 NOTE — Telephone Encounter (Signed)
pt wife called pt appt was moved out until march. but pt will not have enough medications.  can medicaition be called into [facility].

## 2017-01-31 ENCOUNTER — Ambulatory Visit: Payer: 59 | Admitting: Psychiatry

## 2017-02-01 ENCOUNTER — Other Ambulatory Visit: Payer: Self-pay | Admitting: Family Medicine

## 2017-02-14 ENCOUNTER — Other Ambulatory Visit: Payer: Self-pay | Admitting: Family Medicine

## 2017-02-22 ENCOUNTER — Other Ambulatory Visit: Payer: Self-pay | Admitting: Family Medicine

## 2017-02-25 ENCOUNTER — Other Ambulatory Visit: Payer: Self-pay

## 2017-02-25 MED ORDER — SIMVASTATIN 40 MG PO TABS: 40.0000 mg | ORAL_TABLET | Freq: Every day | ORAL | 0 refills | Status: DC

## 2017-02-25 NOTE — Telephone Encounter (Signed)
Erik [name] [facility]atin to [facility];advised last CPX 08/2015; Erik Medlock scheduled CPX on 03/21/17; Erik [name] said pt would want to do labs at the CPX appt and pt will be fasting. Refilled simvastatin # 30 to [facility].Erik [name] voiced understanding.

## 2017-02-28 ENCOUNTER — Ambulatory Visit: Payer: 59 | Admitting: Psychiatry

## 2017-03-21 ENCOUNTER — Ambulatory Visit (INDEPENDENT_AMBULATORY_CARE_PROVIDER_SITE_OTHER): Payer: 59 | Admitting: Family Medicine

## 2017-03-21 ENCOUNTER — Other Ambulatory Visit: Payer: Self-pay | Admitting: Family Medicine

## 2017-03-21 ENCOUNTER — Encounter: Payer: Self-pay | Admitting: Family Medicine

## 2017-03-21 VITALS — BP 130/90 | HR 69 | Temp 97.8°F | Ht 70.5 in | Wt 224.2 lb

## 2017-03-21 DIAGNOSIS — Z79899 Other long term (current) drug therapy: Secondary | ICD-10-CM

## 2017-03-21 DIAGNOSIS — Z0000 Encounter for general adult medical examination without abnormal findings: Secondary | ICD-10-CM

## 2017-03-21 DIAGNOSIS — Z13220 Encounter for screening for lipoid disorders: Secondary | ICD-10-CM | POA: Diagnosis not present

## 2017-03-21 DIAGNOSIS — Z125 Encounter for screening for malignant neoplasm of prostate: Secondary | ICD-10-CM

## 2017-03-21 LAB — BASIC METABOLIC PANEL
BUN: 19 mg/dL (ref 6–23)
CO2: 30 mEq/L (ref 19–32)
Calcium: 9.8 mg/dL (ref 8.4–10.5)
Chloride: 105 mEq/L (ref 96–112)
Creatinine, Ser: 1.01 mg/dL (ref 0.40–1.50)
GFR: 81.52 mL/min (ref >60.00)
Glucose, Bld: 109 mg/dL — ABNORMAL HIGH (ref 70–99)
Potassium: 4.5 mEq/L (ref 3.5–5.1)
Sodium: 141 mEq/L (ref 135–145)

## 2017-03-21 LAB — CBC WITH DIFFERENTIAL/PLATELET
Basophils Absolute: 0 10*3/uL (ref 0.0–0.1)
Basophils Relative: 0.4 % (ref 0.0–3.0)
Eosinophils Absolute: 0.2 10*3/uL (ref 0.0–0.7)
Eosinophils Relative: 2.4 % (ref 0.0–5.0)
HCT: 43.8 % (ref 39.0–52.0)
Hemoglobin: 15 g/dL (ref 13.0–17.0)
Lymphocytes Relative: 33.8 % (ref 12.0–46.0)
Lymphs Abs: 2.4 10*3/uL (ref 0.7–4.0)
MCHC: 34.3 g/dL (ref 30.0–36.0)
MCV: 87 fl (ref 78.0–100.0)
Monocytes Absolute: 0.4 10*3/uL (ref 0.1–1.0)
Monocytes Relative: 4.9 % (ref 3.0–12.0)
Neutro Abs: 4.2 10*3/uL (ref 1.4–7.7)
Neutrophils Relative %: 58.5 % (ref 43.0–77.0)
Platelets: 255 10*3/uL (ref 150.0–400.0)
RBC: 5.03 Mil/uL (ref 4.22–5.81)
RDW: 12.9 % (ref 11.5–15.5)
WBC: 7.2 10*3/uL (ref 4.0–10.5)

## 2017-03-21 LAB — LIPID PANEL
Cholesterol: 195 mg/dL (ref 0–200)
HDL: 51.2 mg/dL (ref >39.00)
LDL Cholesterol: 126 mg/dL — ABNORMAL HIGH (ref 0–99)
NonHDL: 144.11
Total CHOL/HDL Ratio: 4
Triglycerides: 89 mg/dL (ref 0.0–149.0)
VLDL: 17.8 mg/dL (ref 0.0–40.0)

## 2017-03-21 LAB — HEPATIC FUNCTION PANEL
ALT: 21 U/L (ref 0–53)
AST: 16 U/L (ref 0–37)
Albumin: 5.1 g/dL (ref 3.5–5.2)
Alkaline Phosphatase: 44 U/L (ref 39–117)
Bilirubin, Direct: 0.1 mg/dL (ref 0.0–0.3)
Total Bilirubin: 0.5 mg/dL (ref 0.2–1.2)
Total Protein: 7.6 g/dL (ref 6.0–8.3)

## 2017-03-21 LAB — PSA: PSA: 0.61 ng/mL (ref 0.10–4.00)

## 2017-03-21 MED ORDER — SERTRALINE HCL 100 MG PO TABS: 150.0000 mg | ORAL_TABLET | Freq: Every day | ORAL | 3 refills | Status: DC

## 2017-03-21 NOTE — Progress Notes (Signed)
Pre visit review using our clinic review tool, if applicable. No additional management support is needed unless otherwise documented below in the visit note. 

## 2017-03-21 NOTE — Progress Notes (Signed)
Dr. Frederico Hamman T. Maizee Reinhold, MD, [facility] [place], [address] Phone: [phone] Fax: [phone]  03/21/2017  Patient: Erik [name], MRN: [id number], DOB: 05-Aug-1961, 56 y.o.  Primary Physician:  Owens Loffler, MD   Chief Complaint  Patient presents with  . Annual Exam   Subjective:   Erik [name] is a 56 y.o. pleasant patient who presents with the following:  Preventative Health Maintenance Visit:  Health Maintenance Summary Reviewed and updated, unless pt declines services.  Tobacco History Reviewed. Alcohol: No concerns, no excessive use Exercise Habits: Some activity, rec at least 30 mins 5 times a week STD concerns: no risk or activity to increase risk Drug Use: None Encouraged self-testicular check  Doing better at Zoloft at 150  Health Maintenance  Topic Date Due  . INFLUENZA VACCINE  07/20/2017  . COLONOSCOPY  10/09/2022  . TETANUS/TDAP  03/25/2024  . Hepatitis C Screening  Completed  . HIV Screening  Completed   Immunization History  Administered Date(s) Administered  . Tdap 03/25/2014   Patient Active Problem List   Diagnosis Date Noted  . HYPERLIPIDEMIA 07/07/2009  . VENEREAL WART 06/04/2009  . Generalized anxiety disorder 06/04/2009  . DEPRESSION 06/04/2009  . ALLERGIC RHINITIS 06/04/2009  . GERD 06/04/2009   Past Medical History:  Diagnosis Date  . ALLERGIC RHINITIS 06/04/2009   Qualifier: Diagnosis of  By: Lorelei Pont MD, Frederico Hamman    . ANXIETY 06/04/2009   Qualifier: Diagnosis of  By: Lorelei Pont MD, Frederico Hamman    . DEPRESSION 06/04/2009   Qualifier: Diagnosis of  By: Ellin Mayhew CMA ([place]), Heather    . GERD 06/04/2009   Qualifier: Diagnosis of  By: Ellin Mayhew CMA ([place]), Heather    . HYPERLIPIDEMIA 07/07/2009   Qualifier: Diagnosis of  By: Lorelei Pont MD, Frederico Hamman    . Sleep apnea    uses cpap   Past Surgical History:  Procedure Laterality Date  . ORIF ELBOW FRACTURE  1988   right  . UPPER  GASTROINTESTINAL ENDOSCOPY  1999   dilatation for stricture   Social History   Social History  . Marital status: Married    Spouse name: N/A  . Number of children: N/A  . Years of education: N/A   Occupational History  . Not on file.   Social History Main Topics  . Smoking status: Former Smoker    Types: Cigarettes    Quit date: 08/24/1992  . Smokeless tobacco: Never Used  . Alcohol use 0.0 oz/week     Comment: maybe a 6 pack a week if drinks  . Drug use: No  . Sexual activity: Yes    Partners: Female    Birth control/ protection: None   Other Topics Concern  . Not on file   Social History Narrative  . No narrative on file   Family History  Problem Relation Age of Onset  . Colon cancer Neg Hx   . Stomach cancer Neg Hx   . Esophageal cancer Neg Hx   . Rectal cancer Neg Hx    No Known Allergies  Medication list has been reviewed and updated.   General: Denies fever, chills, sweats. No significant weight loss. Eyes: Denies blurring,significant itching ENT: Denies earache, sore throat, and hoarseness. Cardiovascular: Denies chest pains, palpitations, dyspnea on exertion Respiratory: Denies cough, dyspnea at rest,wheeezing Breast: no concerns about lumps GI: Denies nausea, vomiting, diarrhea, constipation, change in bowel habits, abdominal pain, melena, hematochezia GU: Denies penile discharge, ED, urinary  flow / outflow problems. No STD concerns. Musculoskeletal: Denies back pain, joint pain Derm: Denies rash, itching Neuro: Denies  paresthesias, frequent falls, frequent headaches Psych: Denies depression, anxiety Endocrine: Denies cold intolerance, heat intolerance, polydipsia Heme: Denies enlarged lymph nodes Allergy: No hayfever  Objective:   BP 130/90   Pulse 69   Temp 97.8 F (36.6 C) (Oral)   Ht 5' 10.5" (1.791 m)   Wt 224 lb 4 oz (101.7 kg)   BMI 31.72 kg/m  Ideal Body Weight: Weight in (lb) to have BMI = 25: 176.4  No exam data present  GEN:  well developed, well nourished, no acute distress Eyes: conjunctiva and lids normal, PERRLA, EOMI ENT: TM clear, nares clear, oral exam WNL Neck: supple, no lymphadenopathy, no thyromegaly, no JVD Pulm: clear to auscultation and percussion, respiratory effort normal CV: regular rate and rhythm, S1-S2, no murmur, rub or gallop, no bruits, peripheral pulses normal and symmetric, no cyanosis, clubbing, edema or varicosities GI: soft, non-tender; no hepatosplenomegaly, masses; active bowel sounds all quadrants GU: no hernia, testicular mass, penile discharge Lymph: no cervical, axillary or inguinal adenopathy MSK: gait normal, muscle tone and strength WNL, no joint swelling, effusions, discoloration, crepitus  SKIN: clear, good turgor, color WNL, no rashes, lesions, or ulcerations Neuro: normal mental status, normal strength, sensation, and motion Psych: alert; oriented to person, place and time, normally interactive and not anxious or depressed in appearance. All labs reviewed with patient.  Lipids:    Component Value Date/Time   CHOL 202 (H) 09/08/2015 1526   TRIG 185.0 (H) 09/08/2015 1526   HDL 46.40 09/08/2015 1526   LDLDIRECT 140.9 08/24/2012 1659   VLDL 37.0 09/08/2015 1526   CHOLHDL 4 09/08/2015 1526   CBC: CBC Latest Ref Rng & Units 09/08/2015 12/05/2014 04/17/2014  WBC 4.0 - 10.5 K/uL 8.6 8.4 14.7(H)  Hemoglobin 13.0 - 17.0 g/dL 14.6 15.3 15.5  Hematocrit 39.0 - 52.0 % 43.3 46.3 46.8  Platelets 150.0 - 400.0 K/uL 255.0 264.0 650    Basic Metabolic Panel:    Component Value Date/Time   NA 141 09/08/2015 1526   NA 135 (L) 04/17/2014 2220   K 4.3 09/08/2015 1526   K 4.1 04/17/2014 2220   CL 103 09/08/2015 1526   CL 102 04/17/2014 2220   CO2 30 09/08/2015 1526   CO2 30 04/17/2014 2220   BUN 13 09/08/2015 1526   BUN 17 04/17/2014 2220   CREATININE 0.95 09/08/2015 1526   CREATININE 0.94 04/17/2014 2220   GLUCOSE 88 09/08/2015 1526   GLUCOSE 122 (H) 04/17/2014 2220    CALCIUM 9.6 09/08/2015 1526   CALCIUM 9.2 04/17/2014 2220   Hepatic Function Latest Ref Rng & Units 09/08/2015 04/17/2014 03/25/2014  Total Protein 6.0 - 8.3 g/dL 7.5 8.5(H) 7.5  Albumin 3.5 - 5.2 g/dL 4.9 4.8 4.6  AST 0 - 37 U/L '17 31 18  '$ ALT 0 - 53 U/L 26 44 34  Alk Phosphatase 39 - 117 U/L 50 61 43  Total Bilirubin 0.2 - 1.2 mg/dL 0.4 0.6 0.4  Bilirubin, Direct 0.0 - 0.3 mg/dL 0.1 - 0.1    Lab Results  Component Value Date   TSH 2.415 12/09/2014   Lab Results  Component Value Date   PSA 0.50 09/08/2015   PSA 0.56 03/25/2014   PSA 0.51 08/24/2012    Assessment and Plan:   Healthcare maintenance  Screening PSA (prostate specific antigen) - Plan: PSA  Screening, lipid - Plan: Lipid panel  Encounter for long-term  current use of medication - Plan: Basic metabolic panel, CBC with Differential/Platelet, Hepatic function panel  Health Maintenance Exam: The patient's preventative maintenance and recommended screening tests for an annual wellness exam were reviewed in full today. Brought up to date unless services declined.  Counselled on the importance of diet, exercise, and its role in overall health and mortality. The patient's FH and SH was reviewed, including their home life, tobacco status, and drug and alcohol status.  Follow-up in 1 year for physical exam or additional follow-up below.  Follow-up: No Follow-up on file. Or follow-up in 1 year if not noted.  Meds ordered this encounter  Medications  . sertraline (ZOLOFT) 100 MG tablet    Sig: Take 1.5 tablets (150 mg total) by mouth daily.    Dispense:  135 tablet    Refill:  3    REFILLS FOR FUTURE SCRIPTS   Medications Discontinued During This Encounter  Medication Reason  . sertraline (ZOLOFT) 100 MG tablet Reorder   Orders Placed This Encounter  Procedures  . Basic metabolic panel  . CBC with Differential/Platelet  . Hepatic function panel  . Lipid panel  . PSA    Signed,  Frederico Hamman T. Leith Szafranski,  MD   Allergies as of 03/21/2017   No Known Allergies     Medication List       Accurate as of 03/21/17 10:28 AM. Always use your most recent med list.          fluticasone 50 MCG/ACT nasal spray Commonly known as:  FLONASE PLACE TWO SPRAYS INTO BOTH NOSTRILS DAILY AS NEEDED   loratadine 10 MG tablet Commonly known as:  CLARITIN Take 10 mg by mouth daily as needed.   omeprazole 20 MG capsule Commonly known as:  PRILOSEC TAKE ONE CAPSULE BY MOUTH DAILY   sertraline 100 MG tablet Commonly known as:  ZOLOFT Take 1.5 tablets (150 mg total) by mouth daily.   simvastatin 40 MG tablet Commonly known as:  ZOCOR Take 1 tablet (40 mg total) by mouth daily.

## 2017-03-22 ENCOUNTER — Encounter: Payer: Self-pay | Admitting: *Deleted

## 2017-03-22 ENCOUNTER — Telehealth: Payer: Self-pay

## 2017-03-22 NOTE — Telephone Encounter (Signed)
Letter written and placed in Dr. Cyndie Chime in box for signature.

## 2017-03-22 NOTE — Telephone Encounter (Signed)
V/M left requesting letter for [employer] that pt is stable for taking Sertraline 150 mg daily. Request cb when letter ready for pick up. Last seen annual 03/21/17.

## 2017-03-23 ENCOUNTER — Encounter: Payer: Self-pay | Admitting: Family Medicine

## 2017-03-23 NOTE — Telephone Encounter (Signed)
done

## 2017-03-23 NOTE — Telephone Encounter (Signed)
Left message for Erik [name] that his letter is ready to be picked up at the front desk.

## 2017-11-09 ENCOUNTER — Other Ambulatory Visit: Payer: Self-pay | Admitting: Family Medicine

## 2018-02-08 ENCOUNTER — Ambulatory Visit: Payer: Self-pay

## 2018-02-08 NOTE — Telephone Encounter (Signed)
Pt calling with c/o nasal congestion. Pt is recovering and feels better but is not able to sleep with his CPAP with nose mask. He states it worsens at nighttime. Home care advice given.   Reason for Disposition . Cold with no complications  Answer Assessment - Initial Assessment Questions 1. ONSET: "When did the nasal discharge start?"      No nasal discharge 2. AMOUNT: "How much discharge is there?"      none 3. COUGH: "Do you have a cough?" If yes, ask: "Describe the color of your sputum" (clear, white, yellow, green)     Yes creamy colored 4. RESPIRATORY DISTRESS: "Describe your breathing."      Normal  5. FEVER: "Do you have a fever?" If so, ask: "What is your temperature, how was it measured, and when did it start?"     no 6. SEVERITY: "Overall, how bad are you feeling right now?" (e.g., doesn't interfere with normal activities, staying home from school/work, staying in bed)      Laying in bed or in chair most of the time 7. OTHER SYMPTOMS: "Do you have any other symptoms?" (e.g., sore throat, earache, wheezing, vomiting)     No  Cannot breathe through noses 8. PREGNANCY: "Is there any chance you are pregnant?" "When was your last menstrual period?"     n/a  Protocols used: COMMON COLD-A-AH

## 2018-02-13 ENCOUNTER — Other Ambulatory Visit: Payer: Self-pay | Admitting: Family Medicine

## 2018-03-15 ENCOUNTER — Other Ambulatory Visit: Payer: Self-pay | Admitting: Family Medicine

## 2018-04-05 ENCOUNTER — Encounter: Payer: Self-pay | Admitting: Family Medicine

## 2018-04-11 ENCOUNTER — Other Ambulatory Visit: Payer: Self-pay | Admitting: Family Medicine

## 2018-04-13 ENCOUNTER — Encounter: Payer: Self-pay | Admitting: Family Medicine

## 2018-04-13 ENCOUNTER — Ambulatory Visit (INDEPENDENT_AMBULATORY_CARE_PROVIDER_SITE_OTHER): Payer: 59 | Admitting: Family Medicine

## 2018-04-13 ENCOUNTER — Other Ambulatory Visit: Payer: Self-pay

## 2018-04-13 VITALS — BP 130/86 | HR 75 | Temp 98.3°F | Ht 70.0 in | Wt 232.5 lb

## 2018-04-13 DIAGNOSIS — R1010 Upper abdominal pain, unspecified: Secondary | ICD-10-CM

## 2018-04-13 LAB — LIPID PANEL
Cholesterol: 196 mg/dL (ref 0–200)
HDL: 47.6 mg/dL (ref >39.00)
LDL Cholesterol: 115 mg/dL — ABNORMAL HIGH (ref 0–99)
NonHDL: 148.34
Total CHOL/HDL Ratio: 4
Triglycerides: 168 mg/dL — ABNORMAL HIGH (ref 0.0–149.0)
VLDL: 33.6 mg/dL (ref 0.0–40.0)

## 2018-04-13 LAB — HEPATIC FUNCTION PANEL
ALT: 28 U/L (ref 0–53)
AST: 18 U/L (ref 0–37)
Albumin: 5 g/dL (ref 3.5–5.2)
Alkaline Phosphatase: 48 U/L (ref 39–117)
Bilirubin, Direct: 0.1 mg/dL (ref 0.0–0.3)
Total Bilirubin: 0.4 mg/dL (ref 0.2–1.2)
Total Protein: 7.7 g/dL (ref 6.0–8.3)

## 2018-04-13 LAB — H. PYLORI ANTIBODY, IGG: H Pylori IgG: NEGATIVE

## 2018-04-13 NOTE — Progress Notes (Signed)
Dr. Karleen HampshireSpencer T. Egypt Marchiano, MD, [facility] [address] [place], [id number] Phone: [phone] Fax: [phone]  04/13/2018  Patient: Erik [name], MRN: [id number], DOB: [birth year], 57 y.o.  Primary Physician:  Hannah Beatopland, Emmaleigh Longo, MD   Chief Complaint  Patient presents with  . Stomach Issues   Subjective:   Erik Phillipsodd T Bryngelson is a 57 y.o. very pleasant male patient who presents with the following:  Couple of months, stomach is bothering him, epigastric region. Has some loose stool - normally this has not been a problem. Going to the bathroom only once a day right now. Had some diarrhea last week.   Still looks soft. Always feels like he has gas.   2013 normal colonoscopy.   Taking some ibuprofen.   Past Medical History, Surgical History, Social History, Family History, Problem List, Medications, and Allergies have been reviewed and updated if relevant.  Patient Active Problem List   Diagnosis Date Noted  . HYPERLIPIDEMIA 07/07/2009  . VENEREAL WART 06/04/2009  . Generalized anxiety disorder 06/04/2009  . DEPRESSION 06/04/2009  . ALLERGIC RHINITIS 06/04/2009  . GERD 06/04/2009    Past Medical History:  Diagnosis Date  . ALLERGIC RHINITIS 06/04/2009   Qualifier: Diagnosis of  By: Patsy Lageropland MD, Karleen HampshireSpencer    . ANXIETY 06/04/2009   Qualifier: Diagnosis of  By: Patsy Lageropland MD, Karleen HampshireSpencer    . DEPRESSION 06/04/2009   Qualifier: Diagnosis of  By: Clydene PughWoodard CMA (AAMA), Heather    . GERD 06/04/2009   Qualifier: Diagnosis of  By: Clydene PughWoodard CMA (AAMA), Heather    . HYPERLIPIDEMIA 07/07/2009   Qualifier: Diagnosis of  By: Patsy Lageropland MD, Karleen HampshireSpencer    . Sleep apnea    uses cpap    Past Surgical History:  Procedure Laterality Date  . ORIF ELBOW FRACTURE  1988   right  . UPPER GASTROINTESTINAL ENDOSCOPY  1999   dilatation for stricture    Social History   Socioeconomic History  . Marital status: Married    Spouse name: Not on file  . Number of children:  Not on file  . Years of education: Not on file  . Highest education level: Not on file  Occupational History  . Not on file  Social Needs  . Financial resource strain: Not on file  . Food insecurity:    Worry: Not on file    Inability: Not on file  . Transportation needs:    Medical: Not on file    Non-medical: Not on file  Tobacco Use  . Smoking status: Former Smoker    Types: Cigarettes    Last attempt to quit: 08/24/1992    Years since quitting: 25.6  . Smokeless tobacco: Never Used  Substance and Sexual Activity  . Alcohol use: Yes    Alcohol/week: 0.0 oz    Comment: maybe a 6 pack a week if drinks  . Drug use: No  . Sexual activity: Yes    Partners: Female    Birth control/protection: None  Lifestyle  . Physical activity:    Days per week: Not on file    Minutes per session: Not on file  . Stress: Not on file  Relationships  . Social connections:    Talks on phone: Not on file    Gets together: Not on file    Attends religious service: Not on file    Active member of club or organization: Not on file    Attends meetings of clubs or organizations:  Not on file    Relationship status: Not on file  . Intimate partner violence:    Fear of current or ex partner: Not on file    Emotionally abused: Not on file    Physically abused: Not on file    Forced sexual activity: Not on file  Other Topics Concern  . Not on file  Social History Narrative  . Not on file    Family History  Problem Relation Age of Onset  . Colon cancer Neg Hx   . Stomach cancer Neg Hx   . Esophageal cancer Neg Hx   . Rectal cancer Neg Hx     No Known Allergies  Medication list reviewed and updated in full in [facility].  ROS: GEN: Acute illness details above GI: Tolerating PO intake GU: maintaining adequate hydration and urination Pulm: No SOB Interactive and getting along well at home.  Otherwise, ROS is as per the HPI.  Objective:   BP 130/86   Pulse 75   Temp 98.3 F  (36.8 C) (Oral)   Ht 5\' 10"  (1.778 m)   Wt 232 lb 8 oz (105.5 kg)   BMI 33.36 kg/m   GEN: WDWN, NAD, Non-toxic, A & O x 3 HEENT: Atraumatic, Normocephalic. Neck supple. No masses, No LAD. Ears and Nose: No external deformity. CV: RRR, No M/G/R. No JVD. No thrill. No extra heart sounds. PULM: CTA B, no wheezes, crackles, rhonchi. No retractions. No resp. distress. No accessory muscle use. ABD: S, mild epigastric tenderness, ND, +BS. No rebound. No HSM. EXTR: No c/c/e NEURO Normal gait.  PSYCH: Normally interactive. Conversant. Not depressed or anxious appearing.  Calm demeanor.     Laboratory and Imaging Data:  Assessment and Plan:   Pain of upper abdomen - Plan: Lipid panel, H. pylori antibody, IgG, Hepatic function panel  Unclear source, possibly could all this be from ibuprofen use, he is going to go ahead and stop that right now. Check for H. pylori as well as a liver panel, and pancreatic function.  Continue PPI for now.    Follow-up: No follow-ups on file.  Orders Placed This Encounter  Procedures  . Lipid panel  . H. pylori antibody, IgG  . Hepatic function panel    Signed,  Karleen Hampshire T. Sajan Cheatwood, MD   Allergies as of 04/13/2018   No Known Allergies     Medication List        Accurate as of 04/13/18 11:59 PM. Always use your most recent med list.          fluticasone 50 MCG/ACT nasal spray Commonly known as:  FLONASE SPRAY TWO SPRAYS INTO BOTH NOSTRILS DAILY AS NEEDED   loratadine 10 MG tablet Commonly known as:  CLARITIN Take 10 mg by mouth daily as needed.   omeprazole 20 MG capsule Commonly known as:  PRILOSEC TAKE ONE CAPSULE BY MOUTH DAILY   sertraline 100 MG tablet Commonly known as:  ZOLOFT TAKE 1.5 TABLETS (150 MG TOTAL) BY MOUH DAILY.   simvastatin 40 MG tablet Commonly known as:  ZOCOR TAKE 1 TABLET BY MOUTH DAILY

## 2018-04-14 ENCOUNTER — Other Ambulatory Visit (INDEPENDENT_AMBULATORY_CARE_PROVIDER_SITE_OTHER): Payer: 59

## 2018-04-14 DIAGNOSIS — R1010 Upper abdominal pain, unspecified: Secondary | ICD-10-CM | POA: Diagnosis not present

## 2018-04-14 LAB — LIPASE: Lipase: 49 U/L (ref 11.0–59.0)

## 2018-05-01 ENCOUNTER — Ambulatory Visit (INDEPENDENT_AMBULATORY_CARE_PROVIDER_SITE_OTHER): Payer: 59 | Admitting: Family Medicine

## 2018-05-01 ENCOUNTER — Encounter: Payer: Self-pay | Admitting: Family Medicine

## 2018-05-01 ENCOUNTER — Other Ambulatory Visit: Payer: Self-pay

## 2018-05-01 ENCOUNTER — Encounter: Payer: Self-pay | Admitting: *Deleted

## 2018-05-01 VITALS — BP 130/80 | HR 58 | Temp 98.3°F | Ht 70.0 in | Wt 235.8 lb

## 2018-05-01 DIAGNOSIS — Z125 Encounter for screening for malignant neoplasm of prostate: Secondary | ICD-10-CM | POA: Diagnosis not present

## 2018-05-01 DIAGNOSIS — Z0000 Encounter for general adult medical examination without abnormal findings: Secondary | ICD-10-CM

## 2018-05-01 LAB — CBC WITH DIFFERENTIAL/PLATELET
Basophils Absolute: 0 10*3/uL (ref 0.0–0.1)
Basophils Relative: 0.4 % (ref 0.0–3.0)
Eosinophils Absolute: 0.1 10*3/uL (ref 0.0–0.7)
Eosinophils Relative: 1.3 % (ref 0.0–5.0)
HCT: 42 % (ref 39.0–52.0)
Hemoglobin: 14.7 g/dL (ref 13.0–17.0)
Lymphocytes Relative: 34.3 % (ref 12.0–46.0)
Lymphs Abs: 2.3 10*3/uL (ref 0.7–4.0)
MCHC: 34.9 g/dL (ref 30.0–36.0)
MCV: 87.1 fl (ref 78.0–100.0)
Monocytes Absolute: 0.4 10*3/uL (ref 0.1–1.0)
Monocytes Relative: 6 % (ref 3.0–12.0)
Neutro Abs: 4 10*3/uL (ref 1.4–7.7)
Neutrophils Relative %: 58 % (ref 43.0–77.0)
Platelets: 254 10*3/uL (ref 150.0–400.0)
RBC: 4.83 Mil/uL (ref 4.22–5.81)
RDW: 13.5 % (ref 11.5–15.5)
WBC: 6.8 10*3/uL (ref 4.0–10.5)

## 2018-05-01 LAB — BASIC METABOLIC PANEL
BUN: 14 mg/dL (ref 6–23)
CO2: 29 mEq/L (ref 19–32)
Calcium: 9.5 mg/dL (ref 8.4–10.5)
Chloride: 103 mEq/L (ref 96–112)
Creatinine, Ser: 1 mg/dL (ref 0.40–1.50)
GFR: 82.13 mL/min (ref >60.00)
Glucose, Bld: 105 mg/dL — ABNORMAL HIGH (ref 70–99)
Potassium: 4.2 mEq/L (ref 3.5–5.1)
Sodium: 140 mEq/L (ref 135–145)

## 2018-05-01 LAB — PSA: PSA: 0.66 ng/mL (ref 0.10–4.00)

## 2018-05-01 NOTE — Progress Notes (Signed)
Dr. Frederico Hamman T. Onna Nodal, MD, [facility] Primary Care and Sports Medicine [place], [address] Phone: [phone] Fax: [phone]  05/01/2018  Patient: TAISEI BONNETTE, MRN: [id number], DOB: 02-09-1961, 57 y.o.  Primary Physician:  Owens Loffler, MD   Chief Complaint  Patient presents with  . Annual Exam   Subjective:   Erik [name] is a 57 y.o. pleasant patient who presents with the following:  Preventative Health Maintenance Visit:  Health Maintenance Summary Reviewed and updated, unless pt declines services.  Tobacco History Reviewed. Alcohol: No concerns, no excessive use Exercise Habits: Some activity, rec at least 30 mins 5 times a week STD concerns: no risk or activity to increase risk Drug Use: None Encouraged self-testicular check  Stomach doing ok.   Health Maintenance  Topic Date Due  . INFLUENZA VACCINE  07/20/2018  . COLONOSCOPY  10/09/2022  . TETANUS/TDAP  03/25/2024  . Hepatitis C Screening  Completed  . HIV Screening  Completed   Immunization History  Administered Date(s) Administered  . Tdap 03/25/2014   Patient Active Problem List   Diagnosis Date Noted  . HYPERLIPIDEMIA 07/07/2009  . VENEREAL WART 06/04/2009  . Generalized anxiety disorder 06/04/2009  . DEPRESSION 06/04/2009  . ALLERGIC RHINITIS 06/04/2009  . GERD 06/04/2009   Past Medical History:  Diagnosis Date  . ALLERGIC RHINITIS 06/04/2009   Qualifier: Diagnosis of  By: Lorelei Pont MD, Frederico Hamman    . ANXIETY 06/04/2009   Qualifier: Diagnosis of  By: Lorelei Pont MD, Frederico Hamman    . DEPRESSION 06/04/2009   Qualifier: Diagnosis of  By: Ellin Mayhew CMA ([place]), Heather    . GERD 06/04/2009   Qualifier: Diagnosis of  By: Ellin Mayhew CMA ([place]), Heather    . HYPERLIPIDEMIA 07/07/2009   Qualifier: Diagnosis of  By: Lorelei Pont MD, Frederico Hamman    . Sleep apnea    uses cpap   Past Surgical History:  Procedure Laterality Date  . ORIF ELBOW FRACTURE  1988   right  . UPPER GASTROINTESTINAL  ENDOSCOPY  1999   dilatation for stricture   Social History   Socioeconomic History  . Marital status: Married    Spouse name: Not on file  . Number of children: Not on file  . Years of education: Not on file  . Highest education level: Not on file  Occupational History  . Not on file  Social Needs  . Financial resource strain: Not on file  . Food insecurity:    Worry: Not on file    Inability: Not on file  . Transportation needs:    Medical: Not on file    Non-medical: Not on file  Tobacco Use  . Smoking status: Former Smoker    Types: Cigarettes    Last attempt to quit: 08/24/1992    Years since quitting: 25.7  . Smokeless tobacco: Never Used  Substance and Sexual Activity  . Alcohol use: Yes    Alcohol/week: 0.0 oz    Comment: maybe a 6 pack a week if drinks  . Drug use: No  . Sexual activity: Yes    Partners: Female    Birth control/protection: None  Lifestyle  . Physical activity:    Days per week: Not on file    Minutes per session: Not on file  . Stress: Not on file  Relationships  . Social connections:    Talks on phone: Not on file    Gets together: Not on file    Attends religious service: Not on  file    Active member of club or organization: Not on file    Attends meetings of clubs or organizations: Not on file    Relationship status: Not on file  . Intimate partner violence:    Fear of current or ex partner: Not on file    Emotionally abused: Not on file    Physically abused: Not on file    Forced sexual activity: Not on file  Other Topics Concern  . Not on file  Social History Narrative  . Not on file   Family History  Problem Relation Age of Onset  . Colon cancer Neg Hx   . Stomach cancer Neg Hx   . Esophageal cancer Neg Hx   . Rectal cancer Neg Hx    No Known Allergies  Medication list has been reviewed and updated.   General: Denies fever, chills, sweats. No significant weight loss. Eyes: Denies blurring,significant itching ENT:  Denies earache, sore throat, and hoarseness. Cardiovascular: Denies chest pains, palpitations, dyspnea on exertion Respiratory: Denies cough, dyspnea at rest,wheeezing Breast: no concerns about lumps GI: Denies nausea, vomiting, diarrhea, constipation, change in bowel habits, abdominal pain, melena, hematochezia GU: Denies penile discharge, ED, urinary flow / outflow problems. No STD concerns. Musculoskeletal: Denies back pain, joint pain Derm: Denies rash, itching Neuro: Denies  paresthesias, frequent falls, frequent headaches Psych: Denies depression, anxiety Endocrine: Denies cold intolerance, heat intolerance, polydipsia Heme: Denies enlarged lymph nodes Allergy: No hayfever  Objective:   BP 130/80   Pulse (!) 58   Temp 98.3 F (36.8 C) (Oral)   Ht '5\' 10"'$  (1.778 m)   Wt 235 lb 12 oz (106.9 kg)   BMI 33.83 kg/m  Ideal Body Weight: Weight in (lb) to have BMI = 25: 173.9  No exam data present  GEN: well developed, well nourished, no acute distress Eyes: conjunctiva and lids normal, PERRLA, EOMI ENT: TM clear, nares clear, oral exam WNL Neck: supple, no lymphadenopathy, no thyromegaly, no JVD Pulm: clear to auscultation and percussion, respiratory effort normal CV: regular rate and rhythm, S1-S2, no murmur, rub or gallop, no bruits, peripheral pulses normal and symmetric, no cyanosis, clubbing, edema or varicosities GI: soft, non-tender; no hepatosplenomegaly, masses; active bowel sounds all quadrants GU: no hernia, testicular mass, penile discharge Lymph: no cervical, axillary or inguinal adenopathy MSK: gait normal, muscle tone and strength WNL, no joint swelling, effusions, discoloration, crepitus  SKIN: clear, good turgor, color WNL, no rashes, lesions, or ulcerations Neuro: normal mental status, normal strength, sensation, and motion Psych: alert; oriented to person, place and time, normally interactive and not anxious or depressed in appearance. All labs reviewed with  patient.  Lipids:    Component Value Date/Time   CHOL 196 04/13/2018 1047   TRIG 168.0 (H) 04/13/2018 1047   HDL 47.60 04/13/2018 1047   LDLDIRECT 140.9 08/24/2012 1659   VLDL 33.6 04/13/2018 1047   CHOLHDL 4 04/13/2018 1047   CBC: CBC Latest Ref Rng & Units 03/21/2017 09/08/2015 12/05/2014  WBC 4.0 - 10.5 K/uL 7.2 8.6 8.4  Hemoglobin 13.0 - 17.0 g/dL 15.0 14.6 15.3  Hematocrit 39.0 - 52.0 % 43.8 43.3 46.3  Platelets 150.0 - 400.0 K/uL 255.0 255.0 810.1    Basic Metabolic Panel:    Component Value Date/Time   NA 141 03/21/2017 1020   NA 135 (L) 04/17/2014 2220   K 4.5 03/21/2017 1020   K 4.1 04/17/2014 2220   CL 105 03/21/2017 1020   CL 102 04/17/2014 2220  CO2 30 03/21/2017 1020   CO2 30 04/17/2014 2220   BUN 19 03/21/2017 1020   BUN 17 04/17/2014 2220   CREATININE 1.01 03/21/2017 1020   CREATININE 0.94 04/17/2014 2220   GLUCOSE 109 (H) 03/21/2017 1020   GLUCOSE 122 (H) 04/17/2014 2220   CALCIUM 9.8 03/21/2017 1020   CALCIUM 9.2 04/17/2014 2220   Hepatic Function Latest Ref Rng & Units 04/13/2018 03/21/2017 09/08/2015  Total Protein 6.0 - 8.3 g/dL 7.7 7.6 7.5  Albumin 3.5 - 5.2 g/dL 5.0 5.1 4.9  AST 0 - 37 U/L '18 16 17  '$ ALT 0 - 53 U/L '28 21 26  '$ Alk Phosphatase 39 - 117 U/L 48 44 50  Total Bilirubin 0.2 - 1.2 mg/dL 0.4 0.5 0.4  Bilirubin, Direct 0.0 - 0.3 mg/dL 0.1 0.1 0.1    Lab Results  Component Value Date   TSH 2.415 12/09/2014   Lab Results  Component Value Date   PSA 0.61 03/21/2017   PSA 0.50 09/08/2015   PSA 0.56 03/25/2014    Assessment and Plan:   Healthcare maintenance  Health Maintenance Exam: The patient's preventative maintenance and recommended screening tests for an annual wellness exam were reviewed in full today. Brought up to date unless services declined.  Counselled on the importance of diet, exercise, and its role in overall health and mortality. The patient's FH and SH was reviewed, including their home life, tobacco status, and  drug and alcohol status.  Follow-up in 1 year for physical exam or additional follow-up below.  Follow-up: No follow-ups on file. Or follow-up in 1 year if not noted.  Signed,  Maud Deed. Eisa Necaise, MD   Allergies as of 05/01/2018   No Known Allergies     Medication List        Accurate as of 05/01/18  8:13 AM. Always use your most recent med list.          fluticasone 50 MCG/ACT nasal spray Commonly known as:  FLONASE SPRAY TWO SPRAYS INTO BOTH NOSTRILS DAILY AS NEEDED   loratadine 10 MG tablet Commonly known as:  CLARITIN Take 10 mg by mouth daily as needed.   omeprazole 20 MG capsule Commonly known as:  PRILOSEC TAKE ONE CAPSULE BY MOUTH DAILY   sertraline 100 MG tablet Commonly known as:  ZOLOFT TAKE 1.5 TABLETS (150 MG TOTAL) BY MOUH DAILY.   simvastatin 40 MG tablet Commonly known as:  ZOCOR TAKE 1 TABLET BY MOUTH DAILY

## 2018-05-09 ENCOUNTER — Telehealth: Payer: Self-pay | Admitting: Family Medicine

## 2018-05-09 DIAGNOSIS — L255 Unspecified contact dermatitis due to plants, except food: Secondary | ICD-10-CM | POA: Diagnosis not present

## 2018-05-09 NOTE — Telephone Encounter (Signed)
I saw him last week, I am fine with that.  Prednisone 20 mg 2 tabs po daily for 1 week, then 1 tab po daily #21

## 2018-05-09 NOTE — Telephone Encounter (Signed)
Pt called back to check status of RX request for prednisone   [id number], [place] - [address] [phone] (Phone) [phone] (Fax)

## 2018-05-09 NOTE — Telephone Encounter (Signed)
Copied from CRM [id number]. Topic: Quick Communication - See Telephone Encounter >> May 09, 2018  8:10 AM Waymon Amato wrote: Pt is needing to see if Dr. Patsy Lager will call in prednisone for the poison oak on both arms  [facility] [phone]   Best number [phone]

## 2018-05-09 NOTE — Telephone Encounter (Signed)
Spoke to pt and advised OV may be required. Last Rx 2017. pls advise

## 2018-05-10 MED ORDER — PREDNISONE 20 MG PO TABS: ORAL_TABLET | ORAL | 0 refills | Status: DC

## 2018-05-10 NOTE — Telephone Encounter (Signed)
Left message for Erik [name] that Dr. Patsy Lager has sent in [facility].

## 2018-05-13 ENCOUNTER — Other Ambulatory Visit: Payer: Self-pay | Admitting: Family Medicine

## 2018-06-10 ENCOUNTER — Other Ambulatory Visit: Payer: Self-pay | Admitting: Family Medicine

## 2018-07-12 ENCOUNTER — Other Ambulatory Visit: Payer: Self-pay | Admitting: Family Medicine

## 2018-10-10 ENCOUNTER — Other Ambulatory Visit: Payer: Self-pay | Admitting: Family Medicine

## 2018-11-08 ENCOUNTER — Other Ambulatory Visit: Payer: Self-pay | Admitting: Family Medicine

## 2019-01-09 ENCOUNTER — Other Ambulatory Visit: Payer: Self-pay | Admitting: Family Medicine

## 2019-05-10 ENCOUNTER — Other Ambulatory Visit: Payer: Self-pay | Admitting: Family Medicine

## 2019-05-10 NOTE — Telephone Encounter (Signed)
Please schedule CPE with fasting labs prior with Dr. Copland.  

## 2019-05-10 NOTE — Telephone Encounter (Signed)
Lab 7/31 cpx 8/3 Pt aware

## 2019-06-06 ENCOUNTER — Other Ambulatory Visit: Payer: Self-pay | Admitting: Family Medicine

## 2019-07-13 ENCOUNTER — Other Ambulatory Visit: Payer: Self-pay | Admitting: Family Medicine

## 2019-07-13 DIAGNOSIS — Z131 Encounter for screening for diabetes mellitus: Secondary | ICD-10-CM

## 2019-07-13 DIAGNOSIS — Z0000 Encounter for general adult medical examination without abnormal findings: Secondary | ICD-10-CM

## 2019-07-20 ENCOUNTER — Other Ambulatory Visit: Payer: Self-pay

## 2019-07-20 ENCOUNTER — Other Ambulatory Visit (INDEPENDENT_AMBULATORY_CARE_PROVIDER_SITE_OTHER): Payer: 59

## 2019-07-20 DIAGNOSIS — Z0000 Encounter for general adult medical examination without abnormal findings: Secondary | ICD-10-CM

## 2019-07-20 DIAGNOSIS — Z131 Encounter for screening for diabetes mellitus: Secondary | ICD-10-CM

## 2019-07-20 LAB — CBC WITH DIFFERENTIAL/PLATELET
Basophils Absolute: 0 10*3/uL (ref 0.0–0.1)
Basophils Relative: 0.4 % (ref 0.0–3.0)
Eosinophils Absolute: 0.1 10*3/uL (ref 0.0–0.7)
Eosinophils Relative: 1.9 % (ref 0.0–5.0)
HCT: 45 % (ref 39.0–52.0)
Hemoglobin: 15.3 g/dL (ref 13.0–17.0)
Lymphocytes Relative: 34.2 % (ref 12.0–46.0)
Lymphs Abs: 2.4 10*3/uL (ref 0.7–4.0)
MCHC: 33.9 g/dL (ref 30.0–36.0)
MCV: 88.2 fl (ref 78.0–100.0)
Monocytes Absolute: 0.4 10*3/uL (ref 0.1–1.0)
Monocytes Relative: 5 % (ref 3.0–12.0)
Neutro Abs: 4.2 10*3/uL (ref 1.4–7.7)
Neutrophils Relative %: 58.5 % (ref 43.0–77.0)
Platelets: 260 10*3/uL (ref 150.0–400.0)
RBC: 5.11 Mil/uL (ref 4.22–5.81)
RDW: 12.8 % (ref 11.5–15.5)
WBC: 7.1 10*3/uL (ref 4.0–10.5)

## 2019-07-20 LAB — LIPID PANEL
Cholesterol: 179 mg/dL (ref 0–200)
HDL: 43.1 mg/dL (ref >39.00)
LDL Cholesterol: 111 mg/dL — ABNORMAL HIGH (ref 0–99)
NonHDL: 135.51
Total CHOL/HDL Ratio: 4
Triglycerides: 124 mg/dL (ref 0.0–149.0)
VLDL: 24.8 mg/dL (ref 0.0–40.0)

## 2019-07-20 LAB — HEPATIC FUNCTION PANEL
ALT: 25 U/L (ref 0–53)
AST: 16 U/L (ref 0–37)
Albumin: 5.4 g/dL — ABNORMAL HIGH (ref 3.5–5.2)
Alkaline Phosphatase: 47 U/L (ref 39–117)
Bilirubin, Direct: 0.1 mg/dL (ref 0.0–0.3)
Total Bilirubin: 0.6 mg/dL (ref 0.2–1.2)
Total Protein: 7.9 g/dL (ref 6.0–8.3)

## 2019-07-20 LAB — BASIC METABOLIC PANEL
BUN: 19 mg/dL (ref 6–23)
CO2: 31 mEq/L (ref 19–32)
Calcium: 10 mg/dL (ref 8.4–10.5)
Chloride: 104 mEq/L (ref 96–112)
Creatinine, Ser: 0.96 mg/dL (ref 0.40–1.50)
GFR: 80.65 mL/min (ref >60.00)
Glucose, Bld: 105 mg/dL — ABNORMAL HIGH (ref 70–99)
Potassium: 4.3 mEq/L (ref 3.5–5.1)
Sodium: 142 mEq/L (ref 135–145)

## 2019-07-20 LAB — HEMOGLOBIN A1C: Hgb A1c MFr Bld: 5.9 % (ref 4.6–6.5)

## 2019-07-23 ENCOUNTER — Other Ambulatory Visit: Payer: Self-pay

## 2019-07-23 ENCOUNTER — Ambulatory Visit (INDEPENDENT_AMBULATORY_CARE_PROVIDER_SITE_OTHER): Payer: 59 | Admitting: Family Medicine

## 2019-07-23 ENCOUNTER — Encounter: Payer: Self-pay | Admitting: Family Medicine

## 2019-07-23 VITALS — BP 120/68 | HR 66 | Temp 97.9°F | Ht 69.75 in | Wt 221.5 lb

## 2019-07-23 DIAGNOSIS — Z0000 Encounter for general adult medical examination without abnormal findings: Secondary | ICD-10-CM

## 2019-07-23 LAB — PSA, TOTAL WITH REFLEX TO PSA, FREE: PSA, Total: 0.8 ng/mL (ref <=4.0)

## 2019-07-23 NOTE — Progress Notes (Signed)
Erik Loving Erik. Erik Mellone, MD Primary Care and Sports Medicine [facility] [address] [place], [id number] Phone: [phone]  FAX: [phone]  Erik Phillipsodd Erik [name] - 58 y.o. male  MRN [id number]  Date of Birth: 1961/03/23  Visit Date: 07/23/2019  PCP: Hannah Beatopland, Paris Chiriboga, MD  Referred by: Hannah Beatopland, Kerstie Agent, MD  Chief Complaint  Patient presents with  . Annual Exam   Patient Care Team: Hannah Beatopland, Aayana Reinertsen, MD as PCP - General Subjective:   Erik [name] is a 58 y.o. pleasant patient who presents with the following:  Preventative Health Maintenance Visit:  Health Maintenance Summary Reviewed and updated, unless pt declines services.  Tobacco History Reviewed. Alcohol: No concerns, no excessive use Exercise Habits: Some activity, rec at least 30 mins 5 times a week STD concerns: no risk or activity to increase risk Drug Use: None Encouraged self-testicular check  Wt Readings from Last 3 Encounters:  07/23/19 221 lb 8 oz (100.5 kg)  05/01/18 235 lb 12 oz (106.9 kg)  04/13/18 232 lb 8 oz (105.5 kg)    Up and down at home.  2 grandchildren at home.     Health Maintenance  Topic Date Due  . INFLUENZA VACCINE  07/21/2019  . COLONOSCOPY  10/09/2022  . TETANUS/TDAP  03/25/2024  . Hepatitis C Screening  Completed  . HIV Screening  Completed   Immunization History  Administered Date(s) Administered  . Tdap 03/25/2014   Patient Active Problem List   Diagnosis Date Noted  . HYPERLIPIDEMIA 07/07/2009  . VENEREAL WART 06/04/2009  . Generalized anxiety disorder 06/04/2009  . DEPRESSION 06/04/2009  . ALLERGIC RHINITIS 06/04/2009  . GERD 06/04/2009   Past Medical History:  Diagnosis Date  . ALLERGIC RHINITIS 06/04/2009   Qualifier: Diagnosis of  By: Patsy Lageropland MD, Karleen HampshireSpencer    . ANXIETY 06/04/2009   Qualifier: Diagnosis of  By: Patsy Lageropland MD, Karleen HampshireSpencer    . DEPRESSION 06/04/2009   Qualifier: Diagnosis of  By: Clydene PughWoodard CMA (AAMA), Heather    . GERD  06/04/2009   Qualifier: Diagnosis of  By: Clydene PughWoodard CMA (AAMA), Heather    . HYPERLIPIDEMIA 07/07/2009   Qualifier: Diagnosis of  By: Patsy Lageropland MD, Karleen HampshireSpencer    . Sleep apnea    uses cpap   Past Surgical History:  Procedure Laterality Date  . ORIF ELBOW FRACTURE  1988   right  . UPPER GASTROINTESTINAL ENDOSCOPY  1999   dilatation for stricture   Social History   Socioeconomic History  . Marital status: Married    Spouse name: Not on file  . Number of children: Not on file  . Years of education: Not on file  . Highest education level: Not on file  Occupational History  . Not on file  Social Needs  . Financial resource strain: Not on file  . Food insecurity    Worry: Not on file    Inability: Not on file  . Transportation needs    Medical: Not on file    Non-medical: Not on file  Tobacco Use  . Smoking status: Former Smoker    Types: Cigarettes    Quit date: 08/24/1992    Years since quitting: 26.9  . Smokeless tobacco: Never Used  Substance and Sexual Activity  . Alcohol use: Yes    Alcohol/week: 0.0 standard drinks    Comment: maybe a 6 pack a week if drinks  . Drug use: No  . Sexual activity: Yes    Partners: Female  Birth control/protection: None  Lifestyle  . Physical activity    Days per week: Not on file    Minutes per session: Not on file  . Stress: Not on file  Relationships  . Social Herbalist on phone: Not on file    Gets together: Not on file    Attends religious service: Not on file    Active member of club or organization: Not on file    Attends meetings of clubs or organizations: Not on file    Relationship status: Not on file  . Intimate partner violence    Fear of current or ex partner: Not on file    Emotionally abused: Not on file    Physically abused: Not on file    Forced sexual activity: Not on file  Other Topics Concern  . Not on file  Social History Narrative  . Not on file   Family History  Problem Relation Age of Onset   . Colon cancer Neg Hx   . Stomach cancer Neg Hx   . Esophageal cancer Neg Hx   . Rectal cancer Neg Hx    No Known Allergies  Medication list has been reviewed and updated.   General: Denies fever, chills, sweats. No significant weight loss. Eyes: Denies blurring,significant itching ENT: Denies earache, sore throat, and hoarseness. Cardiovascular: Denies chest pains, palpitations, dyspnea on exertion Respiratory: Denies cough, dyspnea at rest,wheeezing Breast: no concerns about lumps GI: Denies nausea, vomiting, diarrhea, constipation, change in bowel habits, abdominal pain, melena, hematochezia GU: Denies penile discharge, ED, urinary flow / outflow problems. No STD concerns. Musculoskeletal: Denies back pain, joint pain Derm: Denies rash, itching Neuro: Denies  paresthesias, frequent falls, frequent headaches Psych: Denies depression, anxiety Endocrine: Denies cold intolerance, heat intolerance, polydipsia Heme: Denies enlarged lymph nodes Allergy: No hayfever  Objective:   BP 120/68   Pulse 66   Temp 97.9 F (36.6 C) (Temporal)   Ht 5' 9.75" (1.772 m)   Wt 221 lb 8 oz (100.5 kg)   SpO2 96%   BMI 32.01 kg/m  Ideal Body Weight: Weight in (lb) to have BMI = 25: 172.6  Ideal Body Weight: Weight in (lb) to have BMI = 25: 172.6 No exam data present Depression screen [facility] 2/9 07/23/2019 05/01/2018  Decreased Interest 0 0  Down, Depressed, Hopeless 0 0  PHQ - 2 Score 0 0     GEN: well developed, well nourished, no acute distress Eyes: conjunctiva and lids normal, PERRLA, EOMI ENT: TM clear, nares clear, oral exam WNL Neck: supple, no lymphadenopathy, no thyromegaly, no JVD Pulm: clear to auscultation and percussion, respiratory effort normal CV: regular rate and rhythm, S1-S2, no murmur, rub or gallop, no bruits, peripheral pulses normal and symmetric, no cyanosis, clubbing, edema or varicosities GI: soft, non-tender; no hepatosplenomegaly, masses; active bowel sounds all  quadrants GU: no hernia, testicular mass, penile discharge Lymph: no cervical, axillary or inguinal adenopathy MSK: gait normal, muscle tone and strength WNL, no joint swelling, effusions, discoloration, crepitus  SKIN: clear, good turgor, color WNL, no rashes, lesions, or ulcerations Neuro: normal mental status, normal strength, sensation, and motion Psych: alert; oriented to person, place and time, normally interactive and not anxious or depressed in appearance. All labs reviewed with patient. Results for orders placed or performed in visit on 07/20/19  Hemoglobin A1c  Result Value Ref Range   Hgb A1c MFr Bld 5.9 4.6 - 6.5 %  CBC with Differential/Platelet  Result Value Ref Range  WBC 7.1 4.0 - 10.5 K/uL   RBC 5.11 4.22 - 5.81 Mil/uL   Hemoglobin 15.3 13.0 - 17.0 g/dL   HCT 45.445.0 09.839.0 - 11.952.0 %   MCV 88.2 78.0 - 100.0 fl   MCHC 33.9 30.0 - 36.0 g/dL   RDW 14.712.8 82.911.5 - 56.215.5 %   Platelets 260.0 150.0 - 400.0 K/uL   Neutrophils Relative % 58.5 43.0 - 77.0 %   Lymphocytes Relative 34.2 12.0 - 46.0 %   Monocytes Relative 5.0 3.0 - 12.0 %   Eosinophils Relative 1.9 0.0 - 5.0 %   Basophils Relative 0.4 0.0 - 3.0 %   Neutro Abs 4.2 1.4 - 7.7 K/uL   Lymphs Abs 2.4 0.7 - 4.0 K/uL   Monocytes Absolute 0.4 0.1 - 1.0 K/uL   Eosinophils Absolute 0.1 0.0 - 0.7 K/uL   Basophils Absolute 0.0 0.0 - 0.1 K/uL  Basic metabolic panel  Result Value Ref Range   Sodium 142 135 - 145 mEq/L   Potassium 4.3 3.5 - 5.1 mEq/L   Chloride 104 96 - 112 mEq/L   CO2 31 19 - 32 mEq/L   Glucose, Bld 105 (H) 70 - 99 mg/dL   BUN 19 6 - 23 mg/dL   Creatinine, Ser 1.300.96 0.40 - 1.50 mg/dL   Calcium 86.510.0 8.4 - 78.410.5 mg/dL   GFR 69.6280.65 >95.28>60.00 mL/min  Hepatic function panel  Result Value Ref Range   Total Bilirubin 0.6 0.2 - 1.2 mg/dL   Bilirubin, Direct 0.1 0.0 - 0.3 mg/dL   Alkaline Phosphatase 47 39 - 117 U/L   AST 16 0 - 37 U/L   ALT 25 0 - 53 U/L   Total Protein 7.9 6.0 - 8.3 g/dL   Albumin 5.4 (H) 3.5 - 5.2  g/dL  Lipid panel  Result Value Ref Range   Cholesterol 179 0 - 200 mg/dL   Triglycerides 413.2124.0 0.0 - 149.0 mg/dL   HDL 44.0143.10 >02.72>39.00 mg/dL   VLDL 53.624.8 0.0 - 64.440.0 mg/dL   LDL Cholesterol 034111 (H) 0 - 99 mg/dL   Total CHOL/HDL Ratio 4    NonHDL 135.51     Assessment and Plan:     ICD-10-CM   1. Healthcare maintenance  Z00.00    Doing well.  Keep up the weight loss.   Health Maintenance Exam: The patient's preventative maintenance and recommended screening tests for an annual wellness exam were reviewed in full today. Brought up to date unless services declined.  Counselled on the importance of diet, exercise, and its role in overall health and mortality. The patient's FH and SH was reviewed, including their home life, tobacco status, and drug and alcohol status.  Follow-up in 1 year for physical exam or additional follow-up below.  Follow-up: No follow-ups on file. Or follow-up in 1 year if not noted.  No orders of the defined types were placed in this encounter.  Medications Discontinued During This Encounter  Medication Reason  . predniSONE (DELTASONE) 20 MG tablet Completed Course   No orders of the defined types were placed in this encounter.   Signed,  Elpidio GaleaSpencer Erik. Cierria Height, MD   Allergies as of 07/23/2019   No Known Allergies     Medication List       Accurate as of July 23, 2019  9:14 AM. If you have any questions, ask your nurse or doctor.        STOP taking these medications   predniSONE 20 MG tablet Commonly known as: DELTASONE Stopped  by: Hannah BeatSpencer Camrynn Mcclintic, MD     TAKE these medications   fluticasone 50 MCG/ACT nasal spray Commonly known as: FLONASE SPRAY TWO SPRAYS INTO BOTH NOSTRILS DAILY AS NEEDED   loratadine 10 MG tablet Commonly known as: CLARITIN Take 10 mg by mouth daily as needed.   omeprazole 20 MG capsule Commonly known as: PRILOSEC TAKE ONE CAPSULE BY MOUTH DAILY   sertraline 100 MG tablet Commonly known as: ZOLOFT TAKE 1 AND  1/2 TABLETS BY MOUTH DAILY   simvastatin 40 MG tablet Commonly known as: ZOCOR TAKE 1 TABLET BY MOUTH EVERY DAY

## 2019-08-03 ENCOUNTER — Other Ambulatory Visit: Payer: Self-pay | Admitting: Family Medicine

## 2019-08-29 ENCOUNTER — Other Ambulatory Visit: Payer: Self-pay | Admitting: Family Medicine

## 2019-09-10 ENCOUNTER — Encounter: Payer: Self-pay | Admitting: Family Medicine

## 2019-09-11 ENCOUNTER — Ambulatory Visit (INDEPENDENT_AMBULATORY_CARE_PROVIDER_SITE_OTHER): Payer: 59 | Admitting: Internal Medicine

## 2019-09-11 ENCOUNTER — Encounter: Payer: Self-pay | Admitting: Internal Medicine

## 2019-09-11 ENCOUNTER — Other Ambulatory Visit: Payer: Self-pay | Admitting: *Deleted

## 2019-09-11 VITALS — BP 151/88 | Temp 97.7°F

## 2019-09-11 DIAGNOSIS — R05 Cough: Secondary | ICD-10-CM | POA: Diagnosis not present

## 2019-09-11 DIAGNOSIS — R0989 Other specified symptoms and signs involving the circulatory and respiratory systems: Secondary | ICD-10-CM | POA: Diagnosis not present

## 2019-09-11 DIAGNOSIS — R059 Cough, unspecified: Secondary | ICD-10-CM

## 2019-09-11 DIAGNOSIS — Z20822 Contact with and (suspected) exposure to covid-19: Secondary | ICD-10-CM

## 2019-09-11 NOTE — Patient Instructions (Signed)

## 2019-09-11 NOTE — Progress Notes (Signed)
Virtual Visit via Video Note  I connected with Erik [name] on 09/11/19 at  2:15 PM EDT by a video enabled telemedicine application and verified that I am speaking with the correct person using two identifiers.  Location: Patient: Home Provider: Office   I discussed the limitations of evaluation and management by telemedicine and the availability of in person appointments. The patient expressed understanding and agreed to proceed.  History of Present Illness:  Pt reports cough and chest congestion. This started 1 week ago. The cough is productive of brown mucous. He denies headache, runny nose, nasal congestion, ear pain, sore throat or shortness of breath. He denies fever, chills or body aches. He has tried Mucinex with minimal relief. He has a history of allergies but reports this feels different. He is not taking Claritin or Flonase OTC. He has not had contacts with anyone diagnosed with COVID, but admits that he is not wearing a mask in public.   Past Medical History:  Diagnosis Date  . ALLERGIC RHINITIS 06/04/2009   Qualifier: Diagnosis of  By: Lorelei Pont MD, Frederico Hamman    . ANXIETY 06/04/2009   Qualifier: Diagnosis of  By: Lorelei Pont MD, Frederico Hamman    . DEPRESSION 06/04/2009   Qualifier: Diagnosis of  By: Ellin Mayhew CMA ([place]), Heather    . GERD 06/04/2009   Qualifier: Diagnosis of  By: Ellin Mayhew CMA ([place]), Heather    . HYPERLIPIDEMIA 07/07/2009   Qualifier: Diagnosis of  By: Lorelei Pont MD, Frederico Hamman    . Sleep apnea    uses cpap    Current Outpatient Medications  Medication Sig Dispense Refill  . fluticasone (FLONASE) 50 MCG/ACT nasal spray SPRAY TWO SPRAYS INTO BOTH NOSTRILS DAILY AS NEEDED 16 g 11  . loratadine (CLARITIN) 10 MG tablet Take 10 mg by mouth daily as needed.     Marland Kitchen omeprazole (PRILOSEC) 20 MG capsule TAKE 1 CAPSULE BY MOUTH EVERY DAY 90 capsule 3  . sertraline (ZOLOFT) 100 MG tablet TAKE 1 AND 1/2 TABLETS DAILY BY MOUTH 135 tablet 1  . simvastatin (ZOCOR) 40 MG tablet TAKE 1 TABLET BY  MOUTH EVERY DAY 90 tablet 3   No current facility-administered medications for this visit.     No Known Allergies  Family History  Problem Relation Age of Onset  . Colon cancer Neg Hx   . Stomach cancer Neg Hx   . Esophageal cancer Neg Hx   . Rectal cancer Neg Hx     Social History   Socioeconomic History  . Marital status: Married    Spouse name: Not on file  . Number of children: Not on file  . Years of education: Not on file  . Highest education level: Not on file  Occupational History  . Not on file  Social Needs  . Financial resource strain: Not on file  . Food insecurity    Worry: Not on file    Inability: Not on file  . Transportation needs    Medical: Not on file    Non-medical: Not on file  Tobacco Use  . Smoking status: Former Smoker    Types: Cigarettes    Quit date: 08/24/1992    Years since quitting: 27.0  . Smokeless tobacco: Never Used  Substance and Sexual Activity  . Alcohol use: Yes    Alcohol/week: 0.0 standard drinks    Comment: maybe a 6 pack a week if drinks  . Drug use: No  . Sexual activity: Yes    Partners: Female  Birth control/protection: None  Lifestyle  . Physical activity    Days per week: Not on file    Minutes per session: Not on file  . Stress: Not on file  Relationships  . Social Musician on phone: Not on file    Gets together: Not on file    Attends religious service: Not on file    Active member of club or organization: Not on file    Attends meetings of clubs or organizations: Not on file    Relationship status: Not on file  . Intimate partner violence    Fear of current or ex partner: Not on file    Emotionally abused: Not on file    Physically abused: Not on file    Forced sexual activity: Not on file  Other Topics Concern  . Not on file  Social History Narrative  . Not on file     Constitutional: Denies fever, malaise, fatigue, headache or abrupt weight changes.  HEENT: Denies eye pain, eye  redness, ear pain, ringing in the ears, wax buildup, runny nose, nasal congestion, bloody nose, or sore throat. Respiratory: Pt reports cough. Denies difficulty breathing, shortness of breath, or sputum production.   Cardiovascular: Denies chest pain, chest tightness, palpitations or swelling in the hands or feet.  Gastrointestinal: Denies abdominal pain, bloating, constipation, diarrhea or blood in the stool.   No other specific complaints in a complete review of systems (except as listed in HPI above).    Observations/Objective:  Wt Readings from Last 3 Encounters:  07/23/19 221 lb 8 oz (100.5 kg)  05/01/18 235 lb 12 oz (106.9 kg)  04/13/18 232 lb 8 oz (105.5 kg)    General: Appears his stated age, obese, in NAD. HEENT: Head: normal shape and size;  Pulmonary/Chest: Normal effort. No respiratory distress. Coarse cough noted, without audible wheezing.  Neurological: Alert and oriented.  BMET    Component Value Date/Time   NA 142 07/20/2019 0753   NA 135 (L) 04/17/2014 2220   K 4.3 07/20/2019 0753   K 4.1 04/17/2014 2220   CL 104 07/20/2019 0753   CL 102 04/17/2014 2220   CO2 31 07/20/2019 0753   CO2 30 04/17/2014 2220   GLUCOSE 105 (H) 07/20/2019 0753   GLUCOSE 122 (H) 04/17/2014 2220   BUN 19 07/20/2019 0753   BUN 17 04/17/2014 2220   CREATININE 0.96 07/20/2019 0753   CREATININE 0.94 04/17/2014 2220   CALCIUM 10.0 07/20/2019 0753   CALCIUM 9.2 04/17/2014 2220   GFRNONAA >60 04/17/2014 2220   GFRAA >60 04/17/2014 2220    Lipid Panel     Component Value Date/Time   CHOL 179 07/20/2019 0753   TRIG 124.0 07/20/2019 0753   HDL 43.10 07/20/2019 0753   CHOLHDL 4 07/20/2019 0753   VLDL 24.8 07/20/2019 0753   LDLCALC 111 (H) 07/20/2019 0753    CBC    Component Value Date/Time   WBC 7.1 07/20/2019 0753   RBC 5.11 07/20/2019 0753   HGB 15.3 07/20/2019 0753   HGB 15.5 04/17/2014 2220   HCT 45.0 07/20/2019 0753   HCT 46.8 04/17/2014 2220   PLT 260.0 07/20/2019  0753   PLT 207 04/17/2014 2220   MCV 88.2 07/20/2019 0753   MCV 90 04/17/2014 2220   MCH 29.7 04/17/2014 2220   MCHC 33.9 07/20/2019 0753   RDW 12.8 07/20/2019 0753   RDW 13.1 04/17/2014 2220   LYMPHSABS 2.4 07/20/2019 0753   MONOABS 0.4 07/20/2019  0753   EOSABS 0.1 07/20/2019 0753   BASOSABS 0.0 07/20/2019 0753    Hgb A1C Lab Results  Component Value Date   HGBA1C 5.9 07/20/2019        Assessment and Plan:  Cough, Chest Congestion:  DDX include viral respiratory illness including COVID, bronchitis, early PNA Will have him proceed to the [facility] building for [employer] testing Discussed the importance of self quarantine until you receive your lab results Discussed symptomatic care- rest, fluids, Tylenol, Mucinex and Delsym Discussed the importance of masking, handwashing and social distancing UC/ER precautions discussed    Follow Up Instructions:    I discussed the assessment and treatment plan with the patient. The patient was provided an opportunity to ask questions and all were answered. The patient agreed with the plan and demonstrated an understanding of the instructions.   The patient was advised to call back or seek an in-person evaluation if the symptoms worsen or if the condition fails to improve as anticipated.     Nicki Reaper, NP

## 2019-09-13 ENCOUNTER — Encounter: Payer: Self-pay | Admitting: Family Medicine

## 2019-09-13 LAB — NOVEL CORONAVIRUS, NAA: SARS-CoV-2, NAA: NOT DETECTED

## 2019-10-30 ENCOUNTER — Telehealth: Payer: Self-pay | Admitting: Family Medicine

## 2019-10-30 NOTE — Telephone Encounter (Signed)
Patient's wife called.  Patient uses [facility].  Patient has been getting a 30 day supply of Sertraline, Simvastatin, and Omeprazole.  Patient's wife picked up this month's medication.  She'd like the next refill to be for a 90 day rx.

## 2019-10-30 NOTE — Telephone Encounter (Signed)
Mrs. Wickliff notified that 90 day prescriptions were sent in on all 3 medications in Aug & Sept 2020.  They should have them on file for next refills.  I recommended at next refill not to do the automatic refill and put in Rx number but to call and speak with pharmacy and request the refills for the 90 day Rxs on file.

## 2019-11-05 ENCOUNTER — Other Ambulatory Visit: Payer: Self-pay | Admitting: *Deleted

## 2019-11-05 DIAGNOSIS — E785 Hyperlipidemia, unspecified: Secondary | ICD-10-CM

## 2019-11-05 DIAGNOSIS — J309 Allergic rhinitis, unspecified: Secondary | ICD-10-CM

## 2019-11-05 DIAGNOSIS — F411 Generalized anxiety disorder: Secondary | ICD-10-CM

## 2019-11-05 DIAGNOSIS — K219 Gastro-esophageal reflux disease without esophagitis: Secondary | ICD-10-CM

## 2019-11-05 MED ORDER — SERTRALINE HCL 100 MG PO TABS: ORAL_TABLET | ORAL | 1 refills | Status: DC

## 2019-11-05 MED ORDER — FLUTICASONE PROPIONATE 50 MCG/ACT NA SUSP: NASAL | 3 refills | Status: DC

## 2019-11-05 MED ORDER — SIMVASTATIN 40 MG PO TABS: 40.0000 mg | ORAL_TABLET | Freq: Every day | ORAL | 2 refills | Status: DC

## 2019-11-05 MED ORDER — OMEPRAZOLE 20 MG PO CPDR: DELAYED_RELEASE_CAPSULE | ORAL | 2 refills | Status: DC

## 2019-11-06 NOTE — Telephone Encounter (Signed)
Patient's wife called back and stated that insurance will not cover the medication if it is sent into [facility]. She stated that they had [facility] send over a fax for the patient so he can get it from the mail order pharmacy so insurance will cover.   They were advised to call our office to let them know that [facility] sent this

## 2019-11-06 NOTE — Telephone Encounter (Signed)
All medications were re sent to [facility] on 11/05/2019

## 2020-05-09 ENCOUNTER — Other Ambulatory Visit: Payer: Self-pay | Admitting: Family Medicine

## 2020-05-09 DIAGNOSIS — F411 Generalized anxiety disorder: Secondary | ICD-10-CM

## 2020-06-12 ENCOUNTER — Ambulatory Visit: Payer: 59 | Attending: Internal Medicine

## 2020-06-12 DIAGNOSIS — Z20822 Contact with and (suspected) exposure to covid-19: Secondary | ICD-10-CM

## 2020-06-13 LAB — SARS-COV-2, NAA 2 DAY TAT

## 2020-06-13 LAB — NOVEL CORONAVIRUS, NAA: SARS-CoV-2, NAA: NOT DETECTED

## 2020-07-16 ENCOUNTER — Telehealth: Payer: Self-pay | Admitting: Family Medicine

## 2020-07-16 DIAGNOSIS — E785 Hyperlipidemia, unspecified: Secondary | ICD-10-CM

## 2020-07-16 DIAGNOSIS — F411 Generalized anxiety disorder: Secondary | ICD-10-CM

## 2020-07-16 DIAGNOSIS — K219 Gastro-esophageal reflux disease without esophagitis: Secondary | ICD-10-CM

## 2020-07-17 NOTE — Telephone Encounter (Signed)
Please schedule CPE with fasting labs for Dr. Copland. 

## 2020-07-24 ENCOUNTER — Other Ambulatory Visit: Payer: Self-pay | Admitting: Family Medicine

## 2020-07-24 DIAGNOSIS — E785 Hyperlipidemia, unspecified: Secondary | ICD-10-CM

## 2020-07-24 DIAGNOSIS — Z131 Encounter for screening for diabetes mellitus: Secondary | ICD-10-CM

## 2020-07-24 DIAGNOSIS — Z79899 Other long term (current) drug therapy: Secondary | ICD-10-CM

## 2020-07-24 DIAGNOSIS — Z125 Encounter for screening for malignant neoplasm of prostate: Secondary | ICD-10-CM

## 2020-07-24 NOTE — Telephone Encounter (Signed)
Patient called in and scheduled cpe and labs. Labs scheduled for 8/6, please place orders.

## 2020-07-24 NOTE — Telephone Encounter (Signed)
FLP, E78.5  hyperlipidemia  Cbc with diff, HFP, BMET: Z79.899 long-term medication usage Hemoglobin a1c: screening for diabetes PSA: Total with reflex to free: Z12.5 screening for prostate cancer

## 2020-07-24 NOTE — Telephone Encounter (Signed)
Orders entered

## 2020-07-24 NOTE — Telephone Encounter (Signed)
Left message asking pt to call office  °

## 2020-07-25 ENCOUNTER — Other Ambulatory Visit: Payer: Self-pay

## 2020-07-25 ENCOUNTER — Other Ambulatory Visit (INDEPENDENT_AMBULATORY_CARE_PROVIDER_SITE_OTHER): Payer: 59

## 2020-07-25 DIAGNOSIS — E785 Hyperlipidemia, unspecified: Secondary | ICD-10-CM

## 2020-07-25 DIAGNOSIS — Z125 Encounter for screening for malignant neoplasm of prostate: Secondary | ICD-10-CM | POA: Diagnosis not present

## 2020-07-25 DIAGNOSIS — Z79899 Other long term (current) drug therapy: Secondary | ICD-10-CM

## 2020-07-25 DIAGNOSIS — Z131 Encounter for screening for diabetes mellitus: Secondary | ICD-10-CM

## 2020-07-25 LAB — CBC WITH DIFFERENTIAL/PLATELET
Basophils Absolute: 0 10*3/uL (ref 0.0–0.1)
Basophils Relative: 0.5 % (ref 0.0–3.0)
Eosinophils Absolute: 0.1 10*3/uL (ref 0.0–0.7)
Eosinophils Relative: 1.3 % (ref 0.0–5.0)
HCT: 43.3 % (ref 39.0–52.0)
Hemoglobin: 14.6 g/dL (ref 13.0–17.0)
Lymphocytes Relative: 31.5 % (ref 12.0–46.0)
Lymphs Abs: 2.1 10*3/uL (ref 0.7–4.0)
MCHC: 33.7 g/dL (ref 30.0–36.0)
MCV: 90.8 fl (ref 78.0–100.0)
Monocytes Absolute: 0.4 10*3/uL (ref 0.1–1.0)
Monocytes Relative: 5.5 % (ref 3.0–12.0)
Neutro Abs: 4 10*3/uL (ref 1.4–7.7)
Neutrophils Relative %: 61.2 % (ref 43.0–77.0)
Platelets: 223 10*3/uL (ref 150.0–400.0)
RBC: 4.77 Mil/uL (ref 4.22–5.81)
RDW: 12.9 % (ref 11.5–15.5)
WBC: 6.5 10*3/uL (ref 4.0–10.5)

## 2020-07-25 LAB — LIPID PANEL
Cholesterol: 208 mg/dL — ABNORMAL HIGH (ref 0–200)
HDL: 44.2 mg/dL (ref >39.00)
LDL Cholesterol: 130 mg/dL — ABNORMAL HIGH (ref 0–99)
NonHDL: 163.53
Total CHOL/HDL Ratio: 5
Triglycerides: 169 mg/dL — ABNORMAL HIGH (ref 0.0–149.0)
VLDL: 33.8 mg/dL (ref 0.0–40.0)

## 2020-07-25 LAB — HEPATIC FUNCTION PANEL
ALT: 19 U/L (ref 0–53)
AST: 15 U/L (ref 0–37)
Albumin: 5.1 g/dL (ref 3.5–5.2)
Alkaline Phosphatase: 40 U/L (ref 39–117)
Bilirubin, Direct: 0.1 mg/dL (ref 0.0–0.3)
Total Bilirubin: 0.5 mg/dL (ref 0.2–1.2)
Total Protein: 7.5 g/dL (ref 6.0–8.3)

## 2020-07-25 LAB — BASIC METABOLIC PANEL
BUN: 16 mg/dL (ref 6–23)
CO2: 32 mEq/L (ref 19–32)
Calcium: 9.9 mg/dL (ref 8.4–10.5)
Chloride: 103 mEq/L (ref 96–112)
Creatinine, Ser: 0.9 mg/dL (ref 0.40–1.50)
GFR: 86.58 mL/min (ref >60.00)
Glucose, Bld: 106 mg/dL — ABNORMAL HIGH (ref 70–99)
Potassium: 4.6 mEq/L (ref 3.5–5.1)
Sodium: 139 mEq/L (ref 135–145)

## 2020-07-25 LAB — HEMOGLOBIN A1C: Hgb A1c MFr Bld: 6 % (ref 4.6–6.5)

## 2020-07-27 NOTE — Progress Notes (Signed)
Erik Walth T. Erik Cianci, MD, CAQ Sports Medicine  Primary Care and Sports Medicine [facility] at [facility] [address] [place], [address]  Phone: [phone]  FAX: [phone]  WILLFORD RABIDEAU - 59 y.o. male  MRN [id number]  Date of Birth: [birth year]  Date: 07/28/2020  PCP: [name], MD  Referral: [name], MD  Chief Complaint  Patient presents with  . Annual Exam    This visit occurred during the SARS-CoV-2 public health emergency.  Safety protocols were in place, including screening questions prior to the visit, additional usage of staff PPE, and extensive cleaning of exam room while observing appropriate contact time as indicated for disinfecting solutions.   Patient Care Team: [name], MD as PCP - General Subjective:   Erik [name] is a 59 y.o. pleasant patient who presents with the following:  Preventative Health Maintenance Visit:  Health Maintenance Summary Reviewed and updated, unless pt declines services.  Tobacco History Reviewed. Alcohol: No concerns, no excessive use Exercise Habits: Some activity, rec at least 30 mins 5 times a week STD concerns: no risk or activity to increase risk Drug Use: None  Covid vaccine?  No  Wt Readings from Last 3 Encounters:  07/28/20 226 lb 8 oz (102.7 kg)  07/23/19 221 lb 8 oz (100.5 kg)  05/01/18 235 lb 12 oz (106.9 kg)     Health Maintenance  Topic Date Due  . COVID-19 Vaccine (1) Never done  . INFLUENZA VACCINE  03/19/2021 (Originally 07/20/2020)  . COLONOSCOPY  10/09/2022  . TETANUS/TDAP  03/25/2024  . Hepatitis C Screening  Completed  . HIV Screening  Completed   Immunization History  Administered Date(s) Administered  . Tdap 03/25/2014   Patient Active Problem List   Diagnosis Date Noted  . HYPERLIPIDEMIA 07/07/2009  . VENEREAL WART 06/04/2009  . Generalized anxiety disorder 06/04/2009  . DEPRESSION 06/04/2009  . ALLERGIC RHINITIS 06/04/2009  . GERD  06/04/2009    Past Medical History:  Diagnosis Date  . ALLERGIC RHINITIS 06/04/2009   Qualifier: Diagnosis of  By: Patsy Lager MD, Karleen Hampshire    . ANXIETY 06/04/2009   Qualifier: Diagnosis of  By: Patsy Lager MD, Karleen Hampshire    . DEPRESSION 06/04/2009   Qualifier: Diagnosis of  By: Clydene Pugh CMA (AAMA), Heather    . GERD 06/04/2009   Qualifier: Diagnosis of  By: Clydene Pugh CMA (AAMA), Heather    . HYPERLIPIDEMIA 07/07/2009   Qualifier: Diagnosis of  By: Patsy Lager MD, Karleen Hampshire    . Sleep apnea    uses cpap    Past Surgical History:  Procedure Laterality Date  . ORIF ELBOW FRACTURE  1988   right  . UPPER GASTROINTESTINAL ENDOSCOPY  1999   dilatation for stricture    Family History  Problem Relation Age of Onset  . Colon cancer Neg Hx   . Stomach cancer Neg Hx   . Esophageal cancer Neg Hx   . Rectal cancer Neg Hx     Past Medical History, Surgical History, Social History, Family History, Problem List, Medications, and Allergies have been reviewed and updated if relevant.  Review of Systems: Pertinent positives are listed above.  Otherwise, a full 14 point review of systems has been done in full and it is negative except where it is noted positive.  Objective:   BP 140/80   Pulse 64   Temp 98.2 F (36.8 C) (Temporal)   Ht 5\' 10"  (1.778 m)   Wt 226 lb 8 oz (102.7 kg)  SpO2 97%   BMI 32.50 kg/m  Ideal Body Weight: Weight in (lb) to have BMI = 25: 173.9  Ideal Body Weight: Weight in (lb) to have BMI = 25: 173.9 No exam data present Depression screen [facility] 2/9 07/23/2019 05/01/2018  Decreased Interest 0 0  Down, Depressed, Hopeless 0 0  PHQ - 2 Score 0 0     GEN: well developed, well nourished, no acute distress Eyes: conjunctiva and lids normal, PERRLA, EOMI ENT: TM clear, nares clear, oral exam WNL Neck: supple, no lymphadenopathy, no thyromegaly, no JVD Pulm: clear to auscultation and percussion, respiratory effort normal CV: regular rate and rhythm, S1-S2, no murmur, rub or gallop, no  bruits, peripheral pulses normal and symmetric, no cyanosis, clubbing, edema or varicosities GI: soft, non-tender; no hepatosplenomegaly, masses; active bowel sounds all quadrants GU: no hernia, testicular mass, penile discharge Lymph: no cervical, axillary or inguinal adenopathy MSK: gait normal, muscle tone and strength WNL, no joint swelling, effusions, discoloration, crepitus  SKIN: clear, good turgor, color WNL, no rashes, lesions, or ulcerations Neuro: normal mental status, normal strength, sensation, and motion Psych: alert; oriented to person, place and time, normally interactive and not anxious or depressed in appearance.  All labs reviewed with patient. Results for orders placed or performed in visit on 07/25/20  Hemoglobin A1c  Result Value Ref Range   Hgb A1c MFr Bld 6.0 4.6 - 6.5 %  CBC with Differential/Platelet  Result Value Ref Range   WBC 6.5 4.0 - 10.5 K/uL   RBC 4.77 4.22 - 5.81 Mil/uL   Hemoglobin 14.6 13.0 - 17.0 g/dL   HCT 62.3 39 - 52 %   MCV 90.8 78.0 - 100.0 fl   MCHC 33.7 30.0 - 36.0 g/dL   RDW 76.2 83.1 - 51.7 %   Platelets 223.0 150 - 400 K/uL   Neutrophils Relative % 61.2 43 - 77 %   Lymphocytes Relative 31.5 12 - 46 %   Monocytes Relative 5.5 3 - 12 %   Eosinophils Relative 1.3 0 - 5 %   Basophils Relative 0.5 0 - 3 %   Neutro Abs 4.0 1.4 - 7.7 K/uL   Lymphs Abs 2.1 0.7 - 4.0 K/uL   Monocytes Absolute 0.4 0 - 1 K/uL   Eosinophils Absolute 0.1 0 - 0 K/uL   Basophils Absolute 0.0 0 - 0 K/uL  Basic metabolic panel  Result Value Ref Range   Sodium 139 135 - 145 mEq/L   Potassium 4.6 3.5 - 5.1 mEq/L   Chloride 103 96 - 112 mEq/L   CO2 32 19 - 32 mEq/L   Glucose, Bld 106 (H) 70 - 99 mg/dL   BUN 16 6 - 23 mg/dL   Creatinine, Ser 6.16 0.40 - 1.50 mg/dL   GFR 07.37 >10.62 mL/min   Calcium 9.9 8.4 - 10.5 mg/dL  Hepatic function panel  Result Value Ref Range   Total Bilirubin 0.5 0.2 - 1.2 mg/dL   Bilirubin, Direct 0.1 0.0 - 0.3 mg/dL   Alkaline  Phosphatase 40 39 - 117 U/L   AST 15 0 - 37 U/L   ALT 19 0 - 53 U/L   Total Protein 7.5 6.0 - 8.3 g/dL   Albumin 5.1 3.5 - 5.2 g/dL  Lipid panel  Result Value Ref Range   Cholesterol 208 (H) 0 - 200 mg/dL   Triglycerides 694.8 (H) 0 - 149 mg/dL   HDL 54.62 >70.35 mg/dL   VLDL 00.9 0.0 - 38.1 mg/dL  LDL Cholesterol 130 (H) 0 - 99 mg/dL   Total CHOL/HDL Ratio 5    NonHDL 163.53     Assessment and Plan:     ICD-10-CM   1. Healthcare maintenance  Z00.00    He has been eating more poorly during COVID-19 and his cholesterol numbers have gotten worse.  He understands this and he is going to make some diet changes.  He declines COVID-19 vaccination, and he does not think that he will do this in the future.  I did explain that this could lead to mortality as well as long-term morbidity.  Health Maintenance Exam: The patient's preventative maintenance and recommended screening tests for an annual wellness exam were reviewed in full today. Brought up to date unless services declined.  Counselled on the importance of diet, exercise, and its role in overall health and mortality. The patient's FH and SH was reviewed, including their home life, tobacco status, and drug and alcohol status.  Follow-up in 1 year for physical exam or additional follow-up below.  Follow-up: No follow-ups on file. Or follow-up in 1 year if not noted.  No orders of the defined types were placed in this encounter.  There are no discontinued medications. No orders of the defined types were placed in this encounter.   Signed,  Elpidio Galea. Hershall Benkert, MD   Allergies as of 07/28/2020   No Known Allergies     Medication List       Accurate as of July 28, 2020  8:20 AM. If you have any questions, ask your nurse or doctor.        fluticasone 50 MCG/ACT nasal spray Commonly known as: FLONASE SPRAY TWO SPRAYS INTO BOTH NOSTRILS DAILY AS NEEDED   loratadine 10 MG tablet Commonly known as: CLARITIN Take 10  mg by mouth daily as needed.   omeprazole 20 MG capsule Commonly known as: PRILOSEC TAKE 1 CAPSULE BY MOUTH  DAILY   sertraline 100 MG tablet Commonly known as: ZOLOFT TAKE 1 AND 1/2 TABLETS BY  MOUTH DAILY   simvastatin 40 MG tablet Commonly known as: ZOCOR TAKE 1 TABLET BY MOUTH  DAILY

## 2020-07-28 ENCOUNTER — Encounter: Payer: Self-pay | Admitting: Family Medicine

## 2020-07-28 ENCOUNTER — Other Ambulatory Visit: Payer: Self-pay

## 2020-07-28 ENCOUNTER — Ambulatory Visit (INDEPENDENT_AMBULATORY_CARE_PROVIDER_SITE_OTHER): Payer: 59 | Admitting: Family Medicine

## 2020-07-28 VITALS — BP 140/80 | HR 64 | Temp 98.2°F | Ht 70.0 in | Wt 226.5 lb

## 2020-07-28 DIAGNOSIS — Z0000 Encounter for general adult medical examination without abnormal findings: Secondary | ICD-10-CM | POA: Diagnosis not present

## 2020-07-28 LAB — PSA, TOTAL WITH REFLEX TO PSA, FREE: PSA, Total: 0.5 ng/mL (ref <=4.0)

## 2020-09-03 ENCOUNTER — Ambulatory Visit (INDEPENDENT_AMBULATORY_CARE_PROVIDER_SITE_OTHER): Payer: Self-pay | Admitting: Family Medicine

## 2020-09-03 ENCOUNTER — Ambulatory Visit (INDEPENDENT_AMBULATORY_CARE_PROVIDER_SITE_OTHER)
Admission: RE | Admit: 2020-09-03 | Discharge: 2020-09-03 | Disposition: A | Discharge status: Home / Self Care | Payer: 59 | Source: Ambulatory Visit | Attending: Family Medicine | Admitting: Family Medicine

## 2020-09-03 ENCOUNTER — Telehealth: Payer: Self-pay

## 2020-09-03 ENCOUNTER — Other Ambulatory Visit: Payer: Self-pay

## 2020-09-03 ENCOUNTER — Encounter: Payer: Self-pay | Admitting: Family Medicine

## 2020-09-03 DIAGNOSIS — S59901A Unspecified injury of right elbow, initial encounter: Secondary | ICD-10-CM | POA: Diagnosis not present

## 2020-09-03 NOTE — Telephone Encounter (Signed)
[facility] Night - Client Nonclinical Telephone Record  [facility] Client [facility] Night - Client Client Site [facility] - Night Physician [name] - MD Contact Type Call Who Is Calling Patient / Member / Family / Caregiver Caller Name [name] Phone Number [phone] Patient Name Erik [name] Patient DOB May 13, 1961 Call Type Message Only Information Provided Reason for Call Request to Schedule Office Appointment Initial Comment Caller states her husband fell yesterday and hurt his elbow. He wants to make an appt. Additional Comment office hours provided Disp. Time Disposition Final User 09/03/2020 7:29:43 AM General Information Provided Yes Pearletha Furl Call Closed By: Pearletha Furl Transaction Date/Time: 09/03/2020 7:26:39 AM (ET)

## 2020-09-03 NOTE — Progress Notes (Signed)
Subjective:    Patient ID: Erik [name], male    DOB: 08/26/1961, 58 y.o.   MRN: [id number]  This visit occurred during the SARS-CoV-2 public health emergency.  Safety protocols were in place, including screening questions prior to the visit, additional usage of staff PPE, and extensive cleaning of exam room while observing appropriate contact time as indicated for disinfecting solutions.    HPI 94 pt of Dr Patsy Lager presents with an elbow injury   Wt Readings from Last 3 Encounters:  09/03/20 225 lb 7 oz (102.3 kg)  07/28/20 226 lb 8 oz (102.7 kg)  07/23/19 221 lb 8 oz (100.5 kg)   32.35 kg/m  Yesterday afternoon was on top of tanker trailer and someone turned on pump-it sprayed on him and he fell  Landed on elbow from standing (not off the tank)  Went home early  Then iced it   This am much more stiff  Hard time moving it  Some pain shoots down the arm  ? 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  FINDINGS: Four views of the right elbow submitted. There is mild displaced fracture of the radial head. This may represent subacute or old fracture with nonunion. Clinical correlation is  necessary.  IMPRESSION: Mild displaced fracture of radial head. This may represent subacute or old fracture with nonunion. Clinical correlation is necessary. No posterior fat pad sign.   Electronically Signed   By: Natasha Mead M.D.   On: 05/15/2015 08:17    Patient Active Problem List   Diagnosis Date Noted  . Elbow injury, right, initial encounter 09/03/2020  . HYPERLIPIDEMIA 07/07/2009  . VENEREAL WART 06/04/2009  . Generalized anxiety disorder 06/04/2009  . DEPRESSION 06/04/2009  . ALLERGIC RHINITIS 06/04/2009  . GERD 06/04/2009   Past Medical History:  Diagnosis Date  . ALLERGIC RHINITIS 06/04/2009   Qualifier: Diagnosis of  By: Patsy Lager MD, Karleen Hampshire    . ANXIETY 06/04/2009   Qualifier: Diagnosis of  By: Patsy Lager MD, Karleen Hampshire    . DEPRESSION 06/04/2009   Qualifier: Diagnosis of  By: Clydene Pugh CMA (AAMA), Heather    . GERD 06/04/2009   Qualifier: Diagnosis of  By: Clydene Pugh CMA (AAMA), Heather    . HYPERLIPIDEMIA 07/07/2009   Qualifier: Diagnosis of  By: Patsy Lager MD, Karleen Hampshire    . Sleep apnea    uses cpap   Past Surgical History:  Procedure Laterality Date  . ORIF ELBOW FRACTURE  1988   right  . UPPER GASTROINTESTINAL ENDOSCOPY  1999   dilatation for stricture   Social History   Tobacco Use  . Smoking status: Former  Smoker    Types: Cigarettes    Quit date: 08/24/1992    Years since quitting: 28.0  . Smokeless tobacco: Never Used  Substance Use Topics  . Alcohol use: Yes    Alcohol/week: 0.0 standard drinks    Comment: maybe a 6 pack a week if drinks  . Drug use: No   Family History  Problem Relation Age of Onset  . Colon cancer Neg Hx   . Stomach cancer Neg Hx   . Esophageal cancer Neg Hx   . Rectal cancer Neg Hx    No Known Allergies Current Outpatient Medications on File Prior to Visit  Medication Sig Dispense Refill  . fluticasone (FLONASE) 50 MCG/ACT nasal spray SPRAY TWO SPRAYS INTO BOTH NOSTRILS DAILY AS NEEDED 48 g 3  . loratadine (CLARITIN) 10 MG  tablet Take 10 mg by mouth daily as needed.     Marland Kitchen omeprazole (PRILOSEC) 20 MG capsule TAKE 1 CAPSULE BY MOUTH  DAILY 90 capsule 0  . sertraline (ZOLOFT) 100 MG tablet TAKE 1 AND 1/2 TABLETS BY  MOUTH DAILY 135 tablet 0  . simvastatin (ZOCOR) 40 MG tablet TAKE 1 TABLET BY MOUTH  DAILY 90 tablet 0   No current facility-administered medications on file prior to visit.    Review of Systems  Constitutional: Negative for activity change, appetite change, fatigue, fever and unexpected weight change.  HENT: Negative for congestion, rhinorrhea, sore throat and trouble swallowing.   Eyes: Negative for pain, redness, itching and visual disturbance.  Respiratory: Negative for cough, chest tightness, shortness of breath and wheezing.   Cardiovascular: Negative for chest pain and palpitations.  Gastrointestinal: Negative for abdominal pain, blood in stool, constipation, diarrhea and nausea.  Endocrine: Negative for cold intolerance, heat intolerance, polydipsia and polyuria.  Genitourinary: Negative for difficulty urinating, dysuria, frequency and urgency.  Musculoskeletal: Negative for arthralgias, joint swelling and myalgias.       R elbow pain and stiffness  Skin: Negative for pallor and rash.  Neurological: Negative for dizziness, tremors, weakness, numbness and headaches.  Hematological: Negative for adenopathy. Does not bruise/bleed easily.  Psychiatric/Behavioral: Negative for decreased concentration and dysphoric mood. The patient is not nervous/anxious.        Objective:   Physical Exam Constitutional:      General: He is not in acute distress.    Appearance: Normal appearance. He is obese. He is not ill-appearing or diaphoretic.  Eyes:     Conjunctiva/sclera: Conjunctivae normal.     Pupils: Pupils are equal, round, and reactive to light.  Cardiovascular:     Rate and Rhythm: Normal rate and regular rhythm.     Pulses: Normal pulses.  Pulmonary:     Effort: Pulmonary effort is  normal. No respiratory distress.     Breath sounds: Normal breath sounds.  Musculoskeletal:        General: Tenderness and signs of injury present.     Right elbow: Effusion present. No deformity or lacerations. Decreased range of motion. Tenderness present in radial head and lateral epicondyle. No olecranon process tenderness.     Cervical back: Neck supple.     Comments: R elbow-no deformity Flex -to 90 deg and extension almost full (with discomfort) No crepitus  Some focal swelling laterally (suspect effusion)  Nl wrist rom and pronation /supination  No bruise or skin injury  Nl perfusion    Skin:    General: Skin is warm and dry.     Coloration: Skin is not pale.  Findings: No bruising or erythema.  Neurological:     Mental Status: He is alert.     Sensory: No sensory deficit.     Motor: No weakness.  Psychiatric:        Mood and Affect: Mood normal.           Assessment & Plan:   Problem List Items Addressed This Visit      Other   Elbow injury, right, initial encounter    From work accident -fell onto r elbow (that was prev operated on in 1980s for radial head fracture) Effusion noted  Xray obt -pend rad rev (old changes noted)  rom is limited by pain but not severely  inst to stay out of work, elevate arm and use ice  Compression sleeve prn  Can try 1% voltaren gel since he does not tolerate nsaids  Further plan after rad review      Relevant Orders   DG Elbow Complete Right (Completed)

## 2020-09-03 NOTE — Assessment & Plan Note (Signed)
From work accident -fell onto r elbow (that was prev operated on in 1980s for radial head fracture) Effusion noted  Xray obt -pend rad rev (old changes noted)  rom is limited by pain but not severely  inst to stay out of work, elevate arm and use ice  Compression sleeve prn  Can try 1% voltaren gel since he does not tolerate nsaids  Further plan after rad review

## 2020-09-03 NOTE — Telephone Encounter (Signed)
Pt has apt today with Dr. Milinda Antis

## 2020-09-03 NOTE — Telephone Encounter (Signed)
Pt was seen

## 2020-09-03 NOTE — Patient Instructions (Signed)
Xray now Continue ice - 10 minutes on and 10 minutes off  Elbow wrap for compression  Elevate when you can    Since oral anti inflammatory medicines bother your stomach- try voltaren (diclofenac) gel over the counter 1% -can use four times daily

## 2020-09-10 ENCOUNTER — Ambulatory Visit (INDEPENDENT_AMBULATORY_CARE_PROVIDER_SITE_OTHER): Payer: Self-pay | Admitting: Family Medicine

## 2020-09-10 ENCOUNTER — Encounter: Payer: Self-pay | Admitting: Family Medicine

## 2020-09-10 ENCOUNTER — Other Ambulatory Visit: Payer: Self-pay

## 2020-09-10 VITALS — BP 124/80 | HR 64 | Temp 98.4°F | Ht 70.0 in | Wt 224.5 lb

## 2020-09-10 DIAGNOSIS — L089 Local infection of the skin and subcutaneous tissue, unspecified: Secondary | ICD-10-CM

## 2020-09-10 DIAGNOSIS — S59901A Unspecified injury of right elbow, initial encounter: Secondary | ICD-10-CM

## 2020-09-10 MED ORDER — CEPHALEXIN 500 MG PO CAPS
1000.0000 mg | ORAL_CAPSULE | Freq: Two times a day (BID) | ORAL | 0 refills | Status: AC
Start: 2020-09-10 — End: 2020-09-20

## 2020-09-10 NOTE — Progress Notes (Signed)
Erik Yoshida T. [name], MD, CAQ Sports Medicine  Primary Care and Sports Medicine [facility] [address] [place], [address]  Phone: [phone]  FAX: [phone]  Erik [name] - 59 y.o. male  MRN [id number]  Date of Birth: 09/25/1961  Date: 09/10/2020  PCP: [name], MD  Referral: [name], MD  Chief Complaint  Patient presents with  . Elbow Pain    Right-Seen by Dr. Milinda Antis on 09/03/20    This visit occurred during the SARS-CoV-2 public health emergency.  Safety protocols were in place, including screening questions prior to the visit, additional usage of staff PPE, and extensive cleaning of exam room while observing appropriate contact time as indicated for disinfecting solutions.   Subjective:   Erik [name] is a 59 y.o. very pleasant male patient with Body mass index is 32.21 kg/m. who presents with the following:  Date of injury: September 02, 2020.  At the time of his injury he was on the top of the tractor trailer and someone turned on a pump and then it started to spread and he fell.  He landed on his elbow.  Ultimately he did leave work early and he lost some motion in his elbow.  His elbow is quite stiff, painful, and he had significant pain in flexion and extension.  He did have an old fracture in the 1980s that on film appears to been a radial head fracture/radial neck fracture.  F/u R elbow injury:  09/03/2020 OV with Dr. Milinda Antis:    Review of Systems is noted in the HPI, as appropriate   Objective:   BP 124/80   Pulse 64   Temp 98.4 F (36.9 C) (Temporal)   Ht 5\' 10"  (1.778 m)   Wt 224 lb 8 oz (101.8 kg)   SpO2 95%   BMI 32.21 kg/m    GEN: No acute distress; alert,appropriate. PULM: Breathing comfortably in no respiratory distress PSYCH: Normally interactive.    Right elbow: He lacks 5 degrees of extension and flexion is normal.  He does not have pain at the radial head or neck.  No pain at the  medial lateral epicondyle.  No pain significantly at the olecranon and pain just distal to this in the soft tissue region.  Nontender with decompression of the radius and ulna as well as the humerus.  Strength is preserved and neurovascular intact throughout  He also has a subtle skin infection around the base of his thumb.  Radiology: DG Elbow Complete Right  Result Date: 09/03/2020 CLINICAL DATA:  RIGHT elbow pain and swelling after fall yesterday, old fracture and surgery in 25s EXAM: RIGHT ELBOW - COMPLETE 3+ VIEW COMPARISON:  05/14/2015 FINDINGS: Osseous mineralization normal. Prior resection of the RIGHT radial head with placement of a radial head prosthesis. Joint effusion present with calcified body adjacent to the anterior margin of the distal humerus. No acute fracture, dislocation, or bone destruction. IMPRESSION: Joint effusion with small calcified loose body adjacent to anterior margin of distal humerus. Prior resection of RIGHT radial head with placement of a prosthesis. No definite acute osseous abnormalities. Electronically Signed   By: 05/16/2015 M.D.   On: 09/03/2020 15:03    Assessment and Plan:     ICD-10-CM   1. Elbow injury, right, initial encounter  S59.901A    Second encounter.  Range of motion is improving and he does have some pain in the distal elbow.  Given his work requirements, I  do not think that he can do his work without risk of further injury.  Reassess in 2 weeks.  I am happy to fill out FMLA paperwork if needed.  For his thumb, Keflex.  Follow-up: Return in about 2 weeks (around 09/24/2020).  Meds ordered this encounter  Medications  . cephALEXin (KEFLEX) 500 MG capsule    Sig: Take 2 capsules (1,000 mg total) by mouth 2 (two) times daily for 10 days.    Dispense:  40 capsule    Refill:  0   There are no discontinued medications. No orders of the defined types were placed in this encounter.   Signed,  Elpidio Galea. Kylo Gavin, MD   Outpatient  Encounter Medications as of 09/10/2020  Medication Sig  . fluticasone (FLONASE) 50 MCG/ACT nasal spray SPRAY TWO SPRAYS INTO BOTH NOSTRILS DAILY AS NEEDED  . loratadine (CLARITIN) 10 MG tablet Take 10 mg by mouth daily as needed.   Marland Kitchen omeprazole (PRILOSEC) 20 MG capsule TAKE 1 CAPSULE BY MOUTH  DAILY  . sertraline (ZOLOFT) 100 MG tablet TAKE 1 AND 1/2 TABLETS BY  MOUTH DAILY  . simvastatin (ZOCOR) 40 MG tablet TAKE 1 TABLET BY MOUTH  DAILY  . cephALEXin (KEFLEX) 500 MG capsule Take 2 capsules (1,000 mg total) by mouth 2 (two) times daily for 10 days.   No facility-administered encounter medications on file as of 09/10/2020.

## 2020-09-16 NOTE — Progress Notes (Signed)
Martrice Apt T. Donatello Kleve, MD, CAQ Sports Medicine  Primary Care and Sports Medicine [facility] [address] [place], [address]  Phone: [phone]  FAX: [phone]  Erik [name] - 59 y.o. male  MRN [id number]  Date of Birth: Nov 22, 1961  Date: 09/17/2020  PCP: [name], MD  Referral: [name], MD  Chief Complaint  Patient presents with  . Follow-up    Right Elbow    This visit occurred during the SARS-CoV-2 public health emergency.  Safety protocols were in place, including screening questions prior to the visit, additional usage of staff PPE, and extensive cleaning of exam room while observing appropriate contact time as indicated for disinfecting solutions.   Subjective:   Erik [name] is a 59 y.o. very pleasant male patient with Body mass index is 32.54 kg/m. who presents with the following:  He is here in follow-up of his R elbow injury described below:  He was initially seen by my partner on September 03, 2020.  His initial x-ray did show some longer-term fractures of her prior injury, but there was no evidence of clear fracture on plain film or on formal report.  Patient continues to have significant pain without any significant improvement.  He is here today for recheck.  09/10/2020 Last OV with [name], MD  Date of injury: September 02, 2020.  At the time of his injury he was on the top of the tractor trailer and someone turned on a pump and then it started to spread and he fell.  He landed on his elbow.  Ultimately he did leave work early and he lost some motion in his elbow.  His elbow is quite stiff, painful, and he had significant pain in flexion and extension.   He did have an old fracture in the 1980s that on film appears to been a radial head fracture/radial neck fracture.   F/u R elbow injury:   09/03/2020 OV with Dr. Milinda Antis:   Review of Systems is noted in the HPI, as appropriate   Objective:    BP 120/74   Pulse 68   Temp 98.5 F (36.9 C) (Temporal)   Ht 5\' 10"  (1.778 m)   Wt 226 lb 12 oz (102.9 kg)   SpO2 96%   BMI 32.54 kg/m    GEN: No acute distress; alert,appropriate. PULM: Breathing comfortably in no respiratory distress PSYCH: Normally interactive.    At the right elbow he does lack terminal extension and is able to flex his elbow approximately normally.  Hand and wrist is entirely nontender as well as along the radius and ulna.  Humerus is nontender.  Does have tenderness at the radial head and with rotation.  Does have some tenderness throughout some of the lateral elbow.  Radiology: DG ELBOW COMPLETE RIGHT (3+VIEW)  Result Date: 09/17/2020 CLINICAL DATA:  Pain after recent trauma EXAM: RIGHT ELBOW - COMPLETE 3+ VIEW COMPARISON:  September 03, 2020 FINDINGS: Frontal, lateral, and bilateral oblique views obtained. Postoperative change noted in the proximal radius with prosthesis in the proximal radial region. There is spurring in this area, stable. There is a nondisplaced fracture of a spur in the proximal radius near the postoperative site with alignment near anatomic in this area. No other evidence of fracture. No dislocation. No appreciable joint effusion. Mild narrowing is noted in the lateral aspect of the elbow joint. No erosion. A calcification is noted slightly anterior to the coracoid process of the proximal ulna.  IMPRESSION: Postoperative change proximal radius with prosthesis in the radial head region. Nondisplaced fracture through a spur arising laterally from the area of postoperative change in the proximal radius. No other evident fracture. No dislocation or joint effusion. Underlying osteoarthritic change. Intra-articular calcification may be of postoperative etiology. These results will be called to the ordering clinician or representative by the Radiologist Assistant, and communication documented in the PACS or [employer]. Electronically Signed   By:  Bretta Bang III M.D.   On: 09/17/2020 10:37   DG Elbow Complete Right  Result Date: 09/03/2020 CLINICAL DATA:  RIGHT elbow pain and swelling after fall yesterday, old fracture and surgery in 13s EXAM: RIGHT ELBOW - COMPLETE 3+ VIEW COMPARISON:  05/14/2015 FINDINGS: Osseous mineralization normal. Prior resection of the RIGHT radial head with placement of a radial head prosthesis. Joint effusion present with calcified body adjacent to the anterior margin of the distal humerus. No acute fracture, dislocation, or bone destruction. IMPRESSION: Joint effusion with small calcified loose body adjacent to anterior margin of distal humerus. Prior resection of RIGHT radial head with placement of a prosthesis. No definite acute osseous abnormalities. Electronically Signed   By: Ulyses Southward M.D.   On: 09/03/2020 15:03     Assessment and Plan:     ICD-10-CM   1. Elbow fracture, right, closed, initial encounter  S42.401A Ambulatory referral to Orthopedic Surgery  2. Elbow injury, right, initial encounter  S59.901A DG ELBOW COMPLETE RIGHT (3+VIEW)    Ambulatory referral to Orthopedic Surgery   Does have an elbow fracture.  Of asked him to minimal rotate at the wrist and elbow, but he can flex and extend at the elbow.  Avoid endpoint movements and do not do anything that would load or where he would have to use his wrist or elbow.  I appreciate Dr. Janee Morn seeing him tomorrow.  No orders of the defined types were placed in this encounter.  There are no discontinued medications. Orders Placed This Encounter  Procedures  . DG ELBOW COMPLETE RIGHT (3+VIEW)  . Ambulatory referral to Orthopedic Surgery    Follow-up: No follow-ups on file.  Signed,  Elpidio Galea. Malory Spurr, MD   Outpatient Encounter Medications as of 09/17/2020  Medication Sig  . cephALEXin (KEFLEX) 500 MG capsule Take 2 capsules (1,000 mg total) by mouth 2 (two) times daily for 10 days.  . fluticasone (FLONASE) 50 MCG/ACT nasal  spray SPRAY TWO SPRAYS INTO BOTH NOSTRILS DAILY AS NEEDED  . loratadine (CLARITIN) 10 MG tablet Take 10 mg by mouth daily as needed.   Marland Kitchen omeprazole (PRILOSEC) 20 MG capsule TAKE 1 CAPSULE BY MOUTH  DAILY  . sertraline (ZOLOFT) 100 MG tablet TAKE 1 AND 1/2 TABLETS BY  MOUTH DAILY  . simvastatin (ZOCOR) 40 MG tablet TAKE 1 TABLET BY MOUTH  DAILY   No facility-administered encounter medications on file as of 09/17/2020.

## 2020-09-17 ENCOUNTER — Ambulatory Visit (INDEPENDENT_AMBULATORY_CARE_PROVIDER_SITE_OTHER)
Admission: RE | Admit: 2020-09-17 | Discharge: 2020-09-17 | Disposition: A | Discharge status: Home / Self Care | Payer: 59 | Source: Ambulatory Visit | Attending: Family Medicine | Admitting: Family Medicine

## 2020-09-17 ENCOUNTER — Encounter: Payer: Self-pay | Admitting: Family Medicine

## 2020-09-17 ENCOUNTER — Other Ambulatory Visit: Payer: Self-pay

## 2020-09-17 ENCOUNTER — Ambulatory Visit (INDEPENDENT_AMBULATORY_CARE_PROVIDER_SITE_OTHER): Payer: Self-pay | Admitting: Family Medicine

## 2020-09-17 ENCOUNTER — Other Ambulatory Visit: Payer: Self-pay | Admitting: Orthopedic Surgery

## 2020-09-17 VITALS — BP 120/74 | HR 68 | Temp 98.5°F | Ht 70.0 in | Wt 226.8 lb

## 2020-09-17 DIAGNOSIS — S59901A Unspecified injury of right elbow, initial encounter: Secondary | ICD-10-CM | POA: Diagnosis not present

## 2020-09-17 DIAGNOSIS — S42401A Unspecified fracture of lower end of right humerus, initial encounter for closed fracture: Secondary | ICD-10-CM

## 2020-09-22 ENCOUNTER — Ambulatory Visit: Payer: 59 | Admitting: Family Medicine

## 2020-09-24 ENCOUNTER — Ambulatory Visit: Payer: 59 | Admitting: Family Medicine

## 2020-10-09 ENCOUNTER — Other Ambulatory Visit: Payer: Self-pay | Admitting: Family Medicine

## 2020-10-09 DIAGNOSIS — E785 Hyperlipidemia, unspecified: Secondary | ICD-10-CM

## 2020-10-09 DIAGNOSIS — F411 Generalized anxiety disorder: Secondary | ICD-10-CM

## 2020-10-09 DIAGNOSIS — K219 Gastro-esophageal reflux disease without esophagitis: Secondary | ICD-10-CM

## 2020-11-05 ENCOUNTER — Telehealth: Payer: Self-pay

## 2020-11-05 NOTE — Telephone Encounter (Signed)
[facility] - Client TELEPHONE ADVICE RECORD [facility] Patient Name: Erik [name] Gender: Male DOB: 1961-01-12 Age: 59 Y 7 M 3 D Return Phone Number: [phone] (Primary), [phone] (Secondary) Address[place] [address] Client [facility] [place] [facility] - Client Client Site [facility] [place] - Day Physician Copland, Karleen Hampshire - MD Contact Type Call Who Is Calling Patient / Member / Family / Caregiver Call Type Triage / Clinical Caller Name Mrs. Estelle Relationship To Patient Spouse Return Phone Number [phone] (Primary) Chief Complaint CONFUSION - new onset Reason for Call Symptomatic / Request for Health Information Initial Comment Caller states, issues with phones last time. Pt has high blood pressure 168/97 highest. Pt is foggy headed. Translation No No Triage Reason Other Nurse Assessment Nurse: Annye English, RN, Angelique Blonder Date/Time (Eastern Time): 11/05/2020 12:30:06 PM Confirm and document reason for call. If symptomatic, describe symptoms. ---Caller returning call stating she is unable to reach the pt and the pt is still not available for assess/triage. States he is at work. See prev record [id number]. Does the patient have any new or worsening symptoms? ---Yes Will a triage be completed? ---No Select reason for no triage. ---Other Please document clinical information provided and list any resource used. ---Advised caller once again, RN is unable to assess/triage w/o speaking directly to the pt. Advised RN had left msg on his phone earlier and for her to give him the message to call the MDO for further asst. Disp. Time Lamount Cohen Time) Disposition Final User 11/05/2020 12:26:15 PM Send to Urgent Queue Terrilee Files 11/05/2020 12:32:26 PM Clinical Call Yes Annye English, RN, Angelique Blonder

## 2020-11-05 NOTE — Telephone Encounter (Signed)
[facility] - Client TELEPHONE ADVICE RECORD [facility] Patient Name: Erik [name] Gender: Male DOB: 1961-05-07 Age: 59 Y 7 M 3 D Return Phone Number: [phone] (Primary), [phone] (Secondary) Address: City/State/Zip: [place] [address] Client [facility] [facility] - Client Client Site [facility] [place] - Day Physician Copland, Karleen Hampshire - MD Contact Type Call Who Is Calling Patient / Member / Family / Caregiver Call Type Triage / Clinical Caller Name Sallye Ober Relationship To Patient Spouse Return Phone Number [phone] (Primary) Chief Complaint Blood Pressure High Reason for Call Symptomatic / Request for Health Information Initial Comment Caller states her husband has blood pressure of 168/97. Translation No No Triage Reason Other Nurse Assessment Nurse: Annye English, RN, Angelique Blonder Date/Time (Eastern Time): 11/05/2020 12:10:53 PM Confirm and document reason for call. If symptomatic, describe symptoms. ---Caller states her husband has blood pressure of 168/97. Pt is not available for assess/triage at this time. Does the patient have any new or worsening symptoms? ---Yes Will a triage be completed? ---No Select reason for no triage. ---Other Please document clinical information provided and list any resource used. ---Pt not available at time. Will call alt number. Caller attempted to 3way pt in on call but disconnected. Disp. Time Lamount Cohen Time) Disposition Final User 11/05/2020 12:09:58 PM Attempt made - message left Carmon, RN, Angelique Blonder 11/05/2020 12:12:06 PM Clinical Call Yes Annye English, RN, Angelique Blonder Comments User: Greggory Stallion, RN Date/Time Lamount Cohen Time): 11/05/2020 12:09:46 PM Caller states the pt is not available for assess/triage.

## 2020-11-05 NOTE — Telephone Encounter (Signed)
I spoke with Mrs Yeagley; (DPR signed) pt is at home from work now and has been resting. Starting this week pt has had H/A on and off, lightheadedness on and off; blurred vision and some foggy headedness on and off. Pt does not have CP,SOB, or weakness in his arms or legs.  Pt has had some pain in both shoulders on and off. Since pt has been home BP now 159/83 did not notice what P was. Pt does not want to go to UC. Pt is going to continue to rest this afternoon and see Dr Patsy Lager in AM for 11/06/20 at 8:40 appt. If condition changes or worsens will go to UC. FYI to Dr Patsy Lager.

## 2020-11-06 ENCOUNTER — Ambulatory Visit: Payer: 59 | Admitting: Family Medicine

## 2020-11-06 ENCOUNTER — Other Ambulatory Visit: Payer: Self-pay

## 2020-11-06 ENCOUNTER — Telehealth: Payer: Self-pay | Admitting: Family Medicine

## 2020-11-06 ENCOUNTER — Encounter: Payer: Self-pay | Admitting: Family Medicine

## 2020-11-06 VITALS — BP 120/86 | HR 68 | Temp 98.2°F | Ht 70.0 in | Wt 227.5 lb

## 2020-11-06 DIAGNOSIS — R519 Headache, unspecified: Secondary | ICD-10-CM

## 2020-11-06 DIAGNOSIS — H538 Other visual disturbances: Secondary | ICD-10-CM

## 2020-11-06 DIAGNOSIS — R270 Ataxia, unspecified: Secondary | ICD-10-CM

## 2020-11-06 DIAGNOSIS — E785 Hyperlipidemia, unspecified: Secondary | ICD-10-CM

## 2020-11-06 DIAGNOSIS — R42 Dizziness and giddiness: Secondary | ICD-10-CM

## 2020-11-06 DIAGNOSIS — I10 Essential (primary) hypertension: Secondary | ICD-10-CM

## 2020-11-06 DIAGNOSIS — R413 Other amnesia: Secondary | ICD-10-CM

## 2020-11-06 MED ORDER — LISINOPRIL-HYDROCHLOROTHIAZIDE 10-12.5 MG PO TABS: 1.0000 | ORAL_TABLET | Freq: Every day | ORAL | 3 refills | Status: DC

## 2020-11-06 NOTE — Patient Instructions (Signed)
Take a 81 mg aspirin every day.

## 2020-11-06 NOTE — Telephone Encounter (Signed)
Patient called around 4pm to follow up on his MRI ordered today - pt states that he was told this would definitely be scheduled today and he was asking why this had not been done yet and him contacted. I attempted to explain to the patient that precerts on MRIs are very time consuming and that I have been getting kick back from his insurance regarding approval. Pt would not listen and kept raising his voice, using a very demeaning tone, interrupting me every time I tried to speak. I advised the patient that he had not been forgotten about and that I have been working on his and he stated that that was untrue and that if he had not called then this would not have been taken care of today - again I tried to explain to him that MRI precerts are very time consuming and hard to get approved at times - Mr Kuss continued interrupting and stated that "this is the medical field and all we medical professionals do all day is give the patients the run around and when I do what I am supposed to do call him!" then the patient hung up the phone.   I spoke with the front desk who initially took the call and Lucile Crater [employer] office Rep) stated that she was spoken to in the same manor.   I discussed this with Dr Patsy Lager.  I attempted the Precert one more time and was able to get it approved. Given the appt specifications this MRI has to be scheduled at [facility] and cannot be done outpatient - the hospital is backed up with inpatient scans and having techs out so they were unable to work the patient In today. They were hoever able to schedule him first thing in the morning Friday 11/07/20 at [facility].   Appt information given to Dr Patsy Lager and he is going to call the patient to discuss this matter and his appt instructions.   Arrive at 7:30am on 11/07/20 to [facility] for an 8:00am appt.  Must have a driver with him Take anti-anxiety medication at 730am

## 2020-11-06 NOTE — Telephone Encounter (Signed)
Patient called in about pre cert for "brain MRI" Patient was aggravated when he called in. He was talking fast and when asked to repeat himself he became even more aggravated. I was attempting to get the info to check to see the status of his request. He demanded to be transferred to [place] in referrals. I then transferred him to her line.

## 2020-11-06 NOTE — Progress Notes (Addendum)
Erik [name] T. Lateka Rady, MD, CAQ Sports Medicine  Primary Care and Sports Medicine [facility] at [facility] [address] [place], [address]  Phone: [phone]  FAX: [phone]  BENJAMIN CASANAS - 59 y.o. male  MRN [id number]  Date of Birth: May 31, 1961  Date: 11/06/2020  PCP: [name], MD  Referral: [name], MD  Chief Complaint  Patient presents with  . Hypertension  . Pressure Behind the Eyes  . Gait Problem  . Short Tempered    This visit occurred during the SARS-CoV-2 public health emergency.  Safety protocols were in place, including screening questions prior to the visit, additional usage of staff PPE, and extensive cleaning of exam room while observing appropriate contact time as indicated for disinfecting solutions.   Subjective:   Erik [name] is a 59 y.o. very pleasant male patient with Body mass index is 32.64 kg/m. who presents with the following:  Pressure behind his eyes, memory loss, balance is off.  BP monitor at home: 170/100.  158/95 on my recheck today in the office  He was worried about diabetes. Lab Results  Component Value Date   HGBA1C 6.0 07/25/2020    Starting earlier this week, the patient started to have some headache, pain and pressure behind his eyes, subjective blurred vision, memory difficulties.  He also thinks he may be more impaired irritable.  He also has had some alteration in his gait.  He has noticed this when getting out and out of his truck.  He has also noticed this other times when he is been driving, and he has felt somewhat weak.  He currently is not on any hypertensive agents.  Cardiac and stroke risk factors include: Blood pressure elevation, hyperlipidemia.  Remote history of risky drug use.  None since the 1980s.  He did smoke distantly and quit in nineteen ninety-five, but he was never a regular smoker.  He also has a brother who recently had CABG.  Memory blurred vision Check  vision  Risk factors: BP  Chol This last year, occ - til about 1995 Has done some riskier drugs 1980's Mom is healthy Brother had CABG Grandparents none   160/90 at home.   Balance is off.  Getting out of the truck and feels funny with moving his head.    ? Anxiety over the years.   Irritable.   A couple possibly. Not much depressed - more confused.   Older brother just found out that he had cabg.  A month ago.    Review of Systems is noted in the HPI, as appropriate  Objective:   BP 120/86   Pulse 68   Temp 98.2 F (36.8 C) (Temporal)   Ht 5\' 10"  (1.778 m)   Wt 227 lb 8 oz (103.2 kg)   SpO2 96%   BMI 32.64 kg/m   GEN: No acute distress; alert,appropriate. PULM: Breathing comfortably in no respiratory distress PSYCH: Normally interactive.  CV: RRR, no m/g/r  PULM: Normal respiratory rate, no accessory muscle use. No wheezes, crackles or rhonchi   Neuro: CN 2-12 grossly intact. PERRLA. EOMI.   The finger-nose appears to be delayed and less accurate on the right side, and this compares to normal on the left  Sensation intact throughout. Str 5/5 all extremities. DTR 2+. No clonus. A and o x 4. Romberg neg. Heel -shin neg.   PSYCH: Normally interactive. Conversant. Not depressed or anxious appearing.  Calm demeanor.     Laboratory and  Imaging Data: Results for orders placed or performed in visit on 07/25/20  PSA, Total with Reflex to PSA, Free  Result Value Ref Range   PSA, Total 0.5 < OR = 4.0 ng/mL  Hemoglobin A1c  Result Value Ref Range   Hgb A1c MFr Bld 6.0 4.6 - 6.5 %  CBC with Differential/Platelet  Result Value Ref Range   WBC 6.5 4.0 - 10.5 K/uL   RBC 4.77 4.22 - 5.81 Mil/uL   Hemoglobin 14.6 13.0 - 17.0 g/dL   HCT 78.9 39 - 52 %   MCV 90.8 78.0 - 100.0 fl   MCHC 33.7 30.0 - 36.0 g/dL   RDW 38.1 01.7 - 51.0 %   Platelets 223.0 150 - 400 K/uL   Neutrophils Relative % 61.2 43 - 77 %   Lymphocytes Relative 31.5 12 - 46 %   Monocytes Relative  5.5 3 - 12 %   Eosinophils Relative 1.3 0 - 5 %   Basophils Relative 0.5 0 - 3 %   Neutro Abs 4.0 1.4 - 7.7 K/uL   Lymphs Abs 2.1 0.7 - 4.0 K/uL   Monocytes Absolute 0.4 0.1 - 1.0 K/uL   Eosinophils Absolute 0.1 0.0 - 0.7 K/uL   Basophils Absolute 0.0 0.0 - 0.1 K/uL  Basic metabolic panel  Result Value Ref Range   Sodium 139 135 - 145 mEq/L   Potassium 4.6 3.5 - 5.1 mEq/L   Chloride 103 96 - 112 mEq/L   CO2 32 19 - 32 mEq/L   Glucose, Bld 106 (H) 70 - 99 mg/dL   BUN 16 6 - 23 mg/dL   Creatinine, Ser 2.58 0.40 - 1.50 mg/dL   GFR 52.77 >82.42 mL/min   Calcium 9.9 8.4 - 10.5 mg/dL  Hepatic function panel  Result Value Ref Range   Total Bilirubin 0.5 0.2 - 1.2 mg/dL   Bilirubin, Direct 0.1 0.0 - 0.3 mg/dL   Alkaline Phosphatase 40 39 - 117 U/L   AST 15 0 - 37 U/L   ALT 19 0 - 53 U/L   Total Protein 7.5 6.0 - 8.3 g/dL   Albumin 5.1 3.5 - 5.2 g/dL  Lipid panel  Result Value Ref Range   Cholesterol 208 (H) 0 - 200 mg/dL   Triglycerides 353.6 (H) 0 - 149 mg/dL   HDL 14.43 >15.40 mg/dL   VLDL 08.6 0.0 - 76.1 mg/dL   LDL Cholesterol 950 (H) 0 - 99 mg/dL   Total CHOL/HDL Ratio 5    NonHDL 163.53     MR Brain Wo Contrast  Result Date: 11/07/2020 CLINICAL DATA:  Neuro deficit. Acute stroke suspected. Eye pressure and headaches. Hypertensive. Memory issues. EXAM: MRI HEAD WITHOUT CONTRAST TECHNIQUE: Multiplanar, multiecho pulse sequences of the brain and surrounding structures were obtained without intravenous contrast. COMPARISON:  MRI January 21, 2015. FINDINGS: Brain: No acute infarction, hemorrhage, hydrocephalus, extra-axial collection or mass lesion. Minimal T2/FLAIR hyperintensities within the white matter, within normal limits for age. Vascular: Major arterial flow voids maintained at the skull base. Non dominant right vertebral artery, similar to prior. Skull and upper cervical spine: Normal marrow signal. Sinuses/Orbits: Mild scattered paranasal sinus mucosal thickening.  Unremarkable orbits. Other: No mastoid effusions. IMPRESSION: No acute intracranial abnormality. Electronically Signed   By: Feliberto Harts MD   On: 11/07/2020 08:49     Assessment and Plan:     ICD-10-CM   1. Headache behind the eyes  R51.9 MR Brain Wo Contrast    Ambulatory referral to Neurology  2. Dizziness  R42 MR Brain Wo Contrast    Ambulatory referral to Neurology  3. Blurred vision, bilateral  H53.8 MR Brain Wo Contrast    Ambulatory referral to Neurology  4. Ataxia  R27.0 MR Brain Wo Contrast    Ambulatory referral to Neurology  5. Memory loss  R41.3 MR Brain Wo Contrast    Ambulatory referral to Neurology  6. Essential hypertension  I10   7. Hyperlipidemia LDL goal <70  E78.5    Total encounter time: 40 minutes. This includes total time spent on the day of encounter.  Chart review, in-depth neurological exam, review of possibilities and explanations of anatomy and pathology.  The patient certainly has high blood pressure, and I am going to start him on Zestoretic.  Additional follow-up will depend upon advanced imaging.  In the setting of subjective headache, dizziness, ataxia, blurred vision, memory loss as well as severe high blood pressure, and what appears to be an abnormal finger-nose exam, obtain a MRI of the brain without contrast to evaluate for stroke.  Addendum: 11/07/20 11:30 AM I spoke with him directly on the phone.  He has an appointment tomorrow at 8 AM at [facility] for his MRI of the brain.  I told the patient and his wife. Electronically Signed  By: [name], MD On: 11/07/2020 11:30 AM   Addendum: 11/07/20 11:30 AM I just spoke to Tawanna Cooler, and he does not have an acute stroke - MRI report is above.  I am still concerned, and his wife is concerned that he could be having TIA's, and he continues to have bad HA, as well as some memory changes per him and his wife.  He has also felt unsteady on his feet with walking.  I am going to have him check his BP BID  until f/u with me in 10 days.  I also want to increase his hyperlipidemia medication to something more potent.  I am also going to involve Neurology in this case, too.  Meds ordered this encounter  Medications  . lisinopril-hydrochlorothiazide (ZESTORETIC) 10-12.5 MG tablet    Sig: Take 1 tablet by mouth daily.    Dispense:  30 tablet    Refill:  3   There are no discontinued medications. Orders Placed This Encounter  Procedures  . MR Brain Wo Contrast  . Ambulatory referral to Neurology    Follow-up: No follow-ups on file.  Signed,  Elpidio Galea. Nickie Deren, MD   Outpatient Encounter Medications as of 11/06/2020  Medication Sig  . fluticasone (FLONASE) 50 MCG/ACT nasal spray SPRAY TWO SPRAYS INTO BOTH NOSTRILS DAILY AS NEEDED  . loratadine (CLARITIN) 10 MG tablet Take 10 mg by mouth daily as needed.   Marland Kitchen omeprazole (PRILOSEC) 20 MG capsule TAKE 1 CAPSULE BY MOUTH  DAILY  . sertraline (ZOLOFT) 100 MG tablet TAKE 1 AND 1/2 TABLETS BY  MOUTH DAILY  . simvastatin (ZOCOR) 40 MG tablet TAKE 1 TABLET BY MOUTH  DAILY  . lisinopril-hydrochlorothiazide (ZESTORETIC) 10-12.5 MG tablet Take 1 tablet by mouth daily.   No facility-administered encounter medications on file as of 11/06/2020.

## 2020-11-07 ENCOUNTER — Ambulatory Visit
Admission: RE | Admit: 2020-11-07 | Discharge: 2020-11-07 | Disposition: A | Discharge status: Home / Self Care | Payer: 59 | Source: Ambulatory Visit | Attending: Family Medicine | Admitting: Family Medicine

## 2020-11-07 ENCOUNTER — Encounter: Payer: Self-pay | Admitting: Neurology

## 2020-11-07 DIAGNOSIS — R519 Headache, unspecified: Secondary | ICD-10-CM | POA: Diagnosis present

## 2020-11-07 DIAGNOSIS — R42 Dizziness and giddiness: Secondary | ICD-10-CM | POA: Diagnosis present

## 2020-11-07 DIAGNOSIS — R270 Ataxia, unspecified: Secondary | ICD-10-CM | POA: Insufficient documentation

## 2020-11-07 DIAGNOSIS — H538 Other visual disturbances: Secondary | ICD-10-CM

## 2020-11-07 DIAGNOSIS — R413 Other amnesia: Secondary | ICD-10-CM | POA: Insufficient documentation

## 2020-11-07 NOTE — Addendum Note (Signed)
Addended by: [name] on: 11/07/2020 11:31 AM   Modules accepted: Orders

## 2020-11-07 NOTE — Telephone Encounter (Signed)
I talked about all of this with the patient

## 2020-11-07 NOTE — Telephone Encounter (Signed)
pts wife (DPR signed)calling while pt is getting MRI this morning; pts wife said for 2 months pt has increased empathy and agitation. Pt has impulse reactions to situations;pt stresses over little things;wife can be talking with pt and pt does not engage in conversation. Wife is concerned pt having mini strokes. Mrs Mollica wanted Dr Patsy Lager to be aware of her comments. Sending note to Dr Patsy Lager and Lupita Leash CMA.

## 2020-11-16 NOTE — Progress Notes (Signed)
[name] T. [name], MD, CAQ Sports Medicine  Primary Care and Sports Medicine [facility] at [facility] [address] [place], [address]  Phone: [phone]   FAX: [phone]  CALE Erik [name] - 59 y.o. male   MRN [id number]   Date of Birth: [birth year]  Date: 11/17/2020   PCP: [name], MD   Referral: [name], MD  Chief Complaint  Patient presents with   Follow-up    This visit occurred during the SARS-CoV-2 public health emergency.  Safety protocols were in place, including screening questions prior to the visit, additional usage of staff PPE, and extensive cleaning of exam room while observing appropriate contact time as indicated for disinfecting solutions.   Subjective:   Erik [name] is a 59 y.o. very pleasant male patient who presents with the following:  Erik [name] is here for follow-up regarding complex medical history and questions from 10 days ago.  His blood pressure has been quite high, and at that point I was concerned for potential CVA.  We did obtain an MRI of his brain, and he did not have any evidence of acute stroke.  At that point, he was having pain behind his eye  He is here in follow-up.  BP:  BP Readings from Last 3 Encounters:  11/17/20 130/70  11/06/20 120/86  09/17/20 120/74    Has been feeling normal.   HA, balance, memory seems normal.   Was smoking, but now off.  He is tolerating his blood pressure medication okay.  At this point all of his symptoms have completely resolved.  They promptly resolved after I saw him 10 days ago.  Review of Systems is noted in the HPI, as appropriate  Objective:   BP 130/70    Pulse 71    Temp 98.2 F (36.8 C) (Temporal)    Ht 5\' 10"  (1.778 m)    Wt 226 lb (102.5 kg)    SpO2 95%    BMI 32.43 kg/m   GEN: WDWN, NAD, Non-toxic HEENT: Atraumatic, Normocephalic. Neck supple. No masses. EXTR: No c/c/et.   Neuro: CN 2-12 grossly intact. PERRLA. EOMI. Sensation intact  throughout. Str 5/5 all extremities. DTR 2+. No clonus. A and o x 4. Romberg neg. Finger nose neg. Heel -shin neg.   PSYCH: Normally interactive. Conversant. Not depressed or anxious appearing.  Calm demeanor.     Laboratory and Imaging Data: MR Brain Wo Contrast  Result Date: 11/07/2020 CLINICAL DATA:  Neuro deficit. Acute stroke suspected. Eye pressure and headaches. Hypertensive. Memory issues. EXAM: MRI HEAD WITHOUT CONTRAST TECHNIQUE: Multiplanar, multiecho pulse sequences of the brain and surrounding structures were obtained without intravenous contrast. COMPARISON:  MRI January 21, 2015. FINDINGS: Brain: No acute infarction, hemorrhage, hydrocephalus, extra-axial collection or mass lesion. Minimal T2/FLAIR hyperintensities within the white matter, within normal limits for age. Vascular: Major arterial flow voids maintained at the skull base. Non dominant right vertebral artery, similar to prior. Skull and upper cervical spine: Normal marrow signal. Sinuses/Orbits: Mild scattered paranasal sinus mucosal thickening. Unremarkable orbits. Other: No mastoid effusions. IMPRESSION: No acute intracranial abnormality. Electronically Signed   By: January 23, 2015 MD   On: 11/07/2020 08:49     Assessment and Plan:     ICD-10-CM   1. TIA (transient ischemic attack)  G45.9   2. Dizziness  R42   3. Headache behind the eyes  R51.9   4. Memory loss  R41.3   5. Blurred vision, bilateral  H53.8  Sx promptly recovered, c/w TIA.  Keep checking BP once a week.  Social Determinants of Health   Stress:   Feeling of Stress : recent stress and anxiety about health and possible CVA   Patient Instructions  Stay on a baby aspirin (81 mg) - forever  Keep taking your blood pressure medicine and cholesterol medicine  No smoking    Meds ordered this encounter  Medications   lisinopril-hydrochlorothiazide (ZESTORETIC) 10-12.5 MG tablet    Sig: Take 1 tablet by mouth daily.    Dispense:  90  tablet    Refill:  3   Medications Discontinued During This Encounter  Medication Reason   lisinopril-hydrochlorothiazide (ZESTORETIC) 10-12.5 MG tablet Reorder   No orders of the defined types were placed in this encounter.   Follow-up: No follow-ups on file.  Signed,  Elpidio Galea. Jillienne Egner, MD   Outpatient Encounter Medications as of 11/17/2020  Medication Sig   fluticasone (FLONASE) 50 MCG/ACT nasal spray SPRAY TWO SPRAYS INTO BOTH NOSTRILS DAILY AS NEEDED   lisinopril-hydrochlorothiazide (ZESTORETIC) 10-12.5 MG tablet Take 1 tablet by mouth daily.   loratadine (CLARITIN) 10 MG tablet Take 10 mg by mouth daily as needed.    omeprazole (PRILOSEC) 20 MG capsule TAKE 1 CAPSULE BY MOUTH  DAILY   sertraline (ZOLOFT) 100 MG tablet TAKE 1 AND 1/2 TABLETS BY  MOUTH DAILY   simvastatin (ZOCOR) 40 MG tablet TAKE 1 TABLET BY MOUTH  DAILY   [DISCONTINUED] lisinopril-hydrochlorothiazide (ZESTORETIC) 10-12.5 MG tablet Take 1 tablet by mouth daily.   No facility-administered encounter medications on file as of 11/17/2020.

## 2020-11-17 ENCOUNTER — Encounter: Payer: Self-pay | Admitting: Family Medicine

## 2020-11-17 ENCOUNTER — Ambulatory Visit: Payer: 59 | Admitting: Family Medicine

## 2020-11-17 ENCOUNTER — Other Ambulatory Visit: Payer: Self-pay

## 2020-11-17 VITALS — BP 130/70 | HR 71 | Temp 98.2°F | Ht 70.0 in | Wt 226.0 lb

## 2020-11-17 DIAGNOSIS — R413 Other amnesia: Secondary | ICD-10-CM

## 2020-11-17 DIAGNOSIS — H538 Other visual disturbances: Secondary | ICD-10-CM

## 2020-11-17 DIAGNOSIS — R519 Headache, unspecified: Secondary | ICD-10-CM

## 2020-11-17 DIAGNOSIS — R42 Dizziness and giddiness: Secondary | ICD-10-CM | POA: Diagnosis not present

## 2020-11-17 DIAGNOSIS — G459 Transient cerebral ischemic attack, unspecified: Secondary | ICD-10-CM | POA: Diagnosis not present

## 2020-11-17 HISTORY — DX: Transient cerebral ischemic attack, unspecified: G45.9

## 2020-11-17 MED ORDER — LISINOPRIL-HYDROCHLOROTHIAZIDE 10-12.5 MG PO TABS
1.0000 | ORAL_TABLET | Freq: Every day | ORAL | 3 refills | Status: DC
Start: 2020-11-17 — End: 2021-04-13

## 2020-11-17 NOTE — Patient Instructions (Signed)
Stay on a baby aspirin (81 mg) - forever  Keep taking your blood pressure medicine and cholesterol medicine  No smoking

## 2021-01-06 ENCOUNTER — Telehealth: Payer: Self-pay | Admitting: *Deleted

## 2021-01-06 NOTE — Telephone Encounter (Signed)
Spoke to patient and was advised that he started with covid symptoms Wednesday. Patient stated that he did a home covid test yesterday and he was positive. Patient stated that he is feeling better, but is still sick.. Patient denies a fever, SOB of difficulty breathing. Patient scheduled for a virtual visit with Dr. Patsy Lager tomorrow 01/07/21 at 11:40 am. Patient stated that he may need a work note.  Patient was given ER precautions and he verbalized understanding.

## 2021-01-07 ENCOUNTER — Encounter: Payer: Self-pay | Admitting: Family Medicine

## 2021-01-07 ENCOUNTER — Telehealth (INDEPENDENT_AMBULATORY_CARE_PROVIDER_SITE_OTHER): Payer: 59 | Admitting: Family Medicine

## 2021-01-07 VITALS — BP 133/81 | HR 80 | Temp 98.0°F | Ht 70.0 in

## 2021-01-07 DIAGNOSIS — U071 COVID-19: Secondary | ICD-10-CM | POA: Diagnosis not present

## 2021-01-07 NOTE — Progress Notes (Signed)
° ° ° ° °  [name], MD Primary Care and Sports Medicine [facility] at [facility] [address] [place], [address] Phone: [phone]   FAX: [phone]  Erik [name] - 60 y.o. male   MRN [id number]   Date of Birth: September 23, 1961  Visit Date: 01/07/2021   PCP: [name], MD   Referred by: [name], MD  Virtual Visit via Video Note:  I connected with  Eli Phillips on 01/07/2021 11:40 AM EST by a video enabled telemedicine application and verified that I am speaking with the correct person using two identifiers.   Location patient: home computer, tablet, or smartphone Location provider: work or home office Consent: Verbal consent directly obtained from [employer]. Persons participating in the virtual visit: patient, provider  I discussed the limitations of evaluation and management by telemedicine and the availability of in person appointments. The patient expressed understanding and agreed to proceed.  Chief Complaint  Patient presents with   Covid Positive    Positive home test on Sunday   Fatigue   Cough    Dry Tickle   Dizziness    History of Present Illness:  Did not have his Covid vaccine.  Home test on Sunday, and it was positive.  Started to feel bad last Wednesday.  Did have a fever.  No energy, feeling tired.  Still feeling quite tired.   Feels like there is a little of something in his chest.  Lightheaded some. Some mild nausea. Eating a lot less.  Drinking ok. Last Thursday was last time that he had a fever - up to 100.5. Now is AF. No neuro changes.  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  BP Readings from Last 3 Encounters:  01/07/21 133/81  11/17/20 130/70  11/06/20 120/86    Immunization History  Administered Date(s) Administered   Tdap 03/25/2014    RTW Monday, 13th - 24th  Review of Systems as above: See pertinent positives and pertinent negatives per HPI No acute  distress verbally   Observations/Objective/Exam:  An attempt was made to discern vital signs over the phone and per patient if applicable and possible.   General:    Alert, Oriented, appears well and in no acute distress  Pulmonary:     On inspection no signs of respiratory distress.  Psych / Neurological:     Pleasant and cooperative.  Assessment and Plan:    ICD-10-CM   1. COVID-19  U07.1    He is doing better, but he is still feeling bad, he has fatigue without all that much exertion.  While he is passed his basic quarantine, and he is still not feeling good enough to return to work at this time.  I am going to keep him out of work until next Monday.  I discussed the assessment and treatment plan with the patient. The patient was provided an opportunity to ask questions and all were answered. The patient agreed with the plan and demonstrated an understanding of the instructions.   The patient was advised to call back or seek an in-person evaluation if the symptoms worsen or if the condition fails to improve as anticipated.  Follow-up: prn unless noted otherwise below No follow-ups on file.  No orders of the defined types were placed in this encounter.  No orders of the defined types were placed in this encounter.   Signed,  Elpidio Galea. Mika Anastasi, MD

## 2021-01-08 ENCOUNTER — Encounter: Payer: Self-pay | Admitting: Family Medicine

## 2021-01-12 NOTE — Telephone Encounter (Signed)
See virtual visit 01/07/21.

## 2021-01-22 ENCOUNTER — Other Ambulatory Visit: Payer: Self-pay | Admitting: *Deleted

## 2021-01-22 DIAGNOSIS — J309 Allergic rhinitis, unspecified: Secondary | ICD-10-CM

## 2021-01-22 MED ORDER — FLUTICASONE PROPIONATE 50 MCG/ACT NA SUSP: NASAL | 3 refills | Status: DC

## 2021-02-06 ENCOUNTER — Ambulatory Visit: Payer: 59 | Admitting: Neurology

## 2021-03-17 ENCOUNTER — Ambulatory Visit (INDEPENDENT_AMBULATORY_CARE_PROVIDER_SITE_OTHER)
Admission: RE | Admit: 2021-03-17 | Discharge: 2021-03-17 | Disposition: A | Discharge status: Home / Self Care | Payer: 59 | Source: Ambulatory Visit | Attending: Family Medicine | Admitting: Family Medicine

## 2021-03-17 ENCOUNTER — Encounter: Payer: Self-pay | Admitting: Family Medicine

## 2021-03-17 ENCOUNTER — Other Ambulatory Visit: Payer: Self-pay

## 2021-03-17 ENCOUNTER — Ambulatory Visit (INDEPENDENT_AMBULATORY_CARE_PROVIDER_SITE_OTHER): Payer: Self-pay | Admitting: Family Medicine

## 2021-03-17 VITALS — BP 140/80 | HR 63 | Temp 97.9°F | Ht 70.0 in | Wt 221.4 lb

## 2021-03-17 DIAGNOSIS — R058 Other specified cough: Secondary | ICD-10-CM | POA: Insufficient documentation

## 2021-03-17 DIAGNOSIS — M542 Cervicalgia: Secondary | ICD-10-CM

## 2021-03-17 DIAGNOSIS — I10 Essential (primary) hypertension: Secondary | ICD-10-CM | POA: Insufficient documentation

## 2021-03-17 MED ORDER — METHOCARBAMOL 500 MG PO TABS: 500.0000 mg | ORAL_TABLET | Freq: Three times a day (TID) | ORAL | 0 refills | Status: DC | PRN

## 2021-03-17 MED ORDER — LOSARTAN POTASSIUM-HCTZ 50-12.5 MG PO TABS: 1.0000 | ORAL_TABLET | Freq: Every day | ORAL | 0 refills | Status: DC

## 2021-03-17 NOTE — Assessment & Plan Note (Signed)
Recently started ACEI/HCTZ, BP well controlled however see below.
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
## 2021-03-17 NOTE — Assessment & Plan Note (Addendum)
Recently started ACEI/HCTZ for blood pressure control, notes dry cough and tickle in back of throat since then.  No better with increased PPI dose points against GERD. Recently got over COVID illness however doubt post-COVID cough.  Anticipate ACEI-induced cough - will trial 1 month of ARB/HCTZ in place of ACEI/HCTZ and update PCP with effect.

## 2021-03-17 NOTE — Assessment & Plan Note (Signed)
Anticipate abrasions and contusion to back and R parietal scalp. Supportive care reviewed. Rx methocarbamol, continue tylenol, add heating pad and stretching exercises. Anticipate will recover well.  Given reproducible midline cervical neck pain, check neck films r/o injury.

## 2021-04-13 ENCOUNTER — Other Ambulatory Visit: Payer: Self-pay

## 2021-04-13 ENCOUNTER — Ambulatory Visit: Payer: 59 | Admitting: Family Medicine

## 2021-04-13 ENCOUNTER — Other Ambulatory Visit: Payer: Self-pay | Admitting: Family Medicine

## 2021-04-13 ENCOUNTER — Encounter: Payer: Self-pay | Admitting: Family Medicine

## 2021-04-13 VITALS — BP 112/70 | HR 65 | Temp 97.8°F | Ht 70.0 in | Wt 214.2 lb

## 2021-04-13 DIAGNOSIS — I10 Essential (primary) hypertension: Secondary | ICD-10-CM

## 2021-04-13 MED ORDER — LOSARTAN POTASSIUM-HCTZ 50-12.5 MG PO TABS: 0.5000 | ORAL_TABLET | Freq: Every day | ORAL | 3 refills | Status: DC

## 2021-04-13 MED ORDER — LOSARTAN POTASSIUM-HCTZ 50-12.5 MG PO TABS: 0.5000 | ORAL_TABLET | Freq: Every day | ORAL | 0 refills | Status: DC

## 2021-04-13 NOTE — Progress Notes (Signed)
Xanthe Couillard T. Quindarius Cabello, MD, CAQ Sports Medicine  Primary Care and Sports Medicine [facility] [address] [place], [address]  Phone: [phone]  FAX: [phone]  EDGAR REISZ - 60 y.o. male  MRN [id number]  Date of Birth: 10/14/1961  Date: 04/13/2021  PCP: [name], MD  Referral: [name], MD  Chief Complaint  Patient presents with  . Follow up BP medication change    This visit occurred during the SARS-CoV-2 public health emergency.  Safety protocols were in place, including screening questions prior to the visit, additional usage of staff PPE, and extensive cleaning of exam room while observing appropriate contact time as indicated for disinfecting solutions.   Subjective:   SEBASTIN PERLMUTTER is a 60 y.o. very pleasant male patient with Body mass index is 30.74 kg/m. who presents with the following:  He did have an ACE inhibitor cough, and my partner Dr. Sharen Hones changed his medication approximately 1 month ago.  He is currently on Hyzaar, at 1/2 tablet daily.  Is not having any side effects.  HTN: Tolerating all medications without side effects Stable and at goal No CP, no sob. No HA.  BP Readings from Last 3 Encounters:  04/13/21 112/70  03/17/21 140/80  01/07/21 133/81    Basic Metabolic Panel:    Component Value Date/Time   NA 139 07/25/2020 0810   NA 135 (L) 04/17/2014 2220   K 4.6 07/25/2020 0810   K 4.1 04/17/2014 2220   CL 103 07/25/2020 0810   CL 102 04/17/2014 2220   CO2 32 07/25/2020 0810   CO2 30 04/17/2014 2220   BUN 16 07/25/2020 0810   BUN 17 04/17/2014 2220   CREATININE 0.90 07/25/2020 0810   CREATININE 0.94 04/17/2014 2220   GLUCOSE 106 (H) 07/25/2020 0810   GLUCOSE 122 (H) 04/17/2014 2220   CALCIUM 9.9 07/25/2020 0810   CALCIUM 9.2 04/17/2014 2220     BP meds - ? Losartan  Dry cough?  112/70  Review of Systems is noted in the HPI, as appropriate  Objective:   BP 112/70    Pulse 65   Temp 97.8 F (36.6 C) (Temporal)   Ht 5\' 10"  (1.778 m)   Wt 214 lb 4 oz (97.2 kg)   SpO2 95%   BMI 30.74 kg/m   GEN: No acute distress; alert,appropriate. PULM: Breathing comfortably in no respiratory distress PSYCH: Normally interactive.  CV: RRR, no m/g/r   Laboratory and Imaging Data:  Assessment and Plan:     ICD-10-CM   1. Essential hypertension  I10    Stable blood pressure control.  He is going to follow-up with me at his routine physical in about 3 or 4 months.  He is losing weight and has had some good diet changes, so if he gets faint in the interval time.  I asked him to call me.  We might be able to decrease his blood pressure medications even further.  Meds ordered this encounter  Medications  . losartan-hydrochlorothiazide (HYZAAR) 50-12.5 MG tablet    Sig: Take 0.5 tablets by mouth daily.    Dispense:  45 tablet    Refill:  3  . losartan-hydrochlorothiazide (HYZAAR) 50-12.5 MG tablet    Sig: Take 0.5 tablets by mouth daily.    Dispense:  15 tablet    Refill:  0   Medications Discontinued During This Encounter  Medication Reason  . lisinopril-hydrochlorothiazide (ZESTORETIC) 10-12.5 MG tablet Change in therapy  .  methocarbamol (ROBAXIN) 500 MG tablet No longer needed (for PRN medications)   No orders of the defined types were placed in this encounter.   Follow-up: No follow-ups on file.  Signed,  Elpidio Galea. Olie Dibert, MD   Outpatient Encounter Medications as of 04/13/2021  Medication Sig  . fluticasone (FLONASE) 50 MCG/ACT nasal spray SPRAY TWO SPRAYS INTO BOTH NOSTRILS DAILY AS NEEDED  . loratadine (CLARITIN) 10 MG tablet Take 10 mg by mouth daily as needed.   Marland Kitchen losartan-hydrochlorothiazide (HYZAAR) 50-12.5 MG tablet Take 1 tablet by mouth daily. (Patient taking differently: Take 0.5 tablets by mouth daily.)  . losartan-hydrochlorothiazide (HYZAAR) 50-12.5 MG tablet Take 0.5 tablets by mouth daily.  Marland Kitchen losartan-hydrochlorothiazide  (HYZAAR) 50-12.5 MG tablet Take 0.5 tablets by mouth daily.  Marland Kitchen omeprazole (PRILOSEC) 20 MG capsule TAKE 1 CAPSULE BY MOUTH  DAILY  . sertraline (ZOLOFT) 100 MG tablet TAKE 1 AND 1/2 TABLETS BY  MOUTH DAILY  . simvastatin (ZOCOR) 40 MG tablet TAKE 1 TABLET BY MOUTH  DAILY  . [DISCONTINUED] methocarbamol (ROBAXIN) 500 MG tablet Take 1 tablet (500 mg total) by mouth 3 (three) times daily as needed for muscle spasms (sedation precautions).  . [DISCONTINUED] lisinopril-hydrochlorothiazide (ZESTORETIC) 10-12.5 MG tablet Take 1 tablet by mouth daily.   No facility-administered encounter medications on file as of 04/13/2021.

## 2021-05-17 ENCOUNTER — Other Ambulatory Visit: Payer: Self-pay | Admitting: Family Medicine

## 2021-07-02 IMAGING — DX DG ELBOW COMPLETE 3+V*R*
4 series · 4 of 4 positions shown · non-contrast
Comparison: September 03, 2020

CLINICAL DATA: Pain after recent trauma

EXAM:
RIGHT ELBOW - COMPLETE 3+ VIEW

[elbow ap]
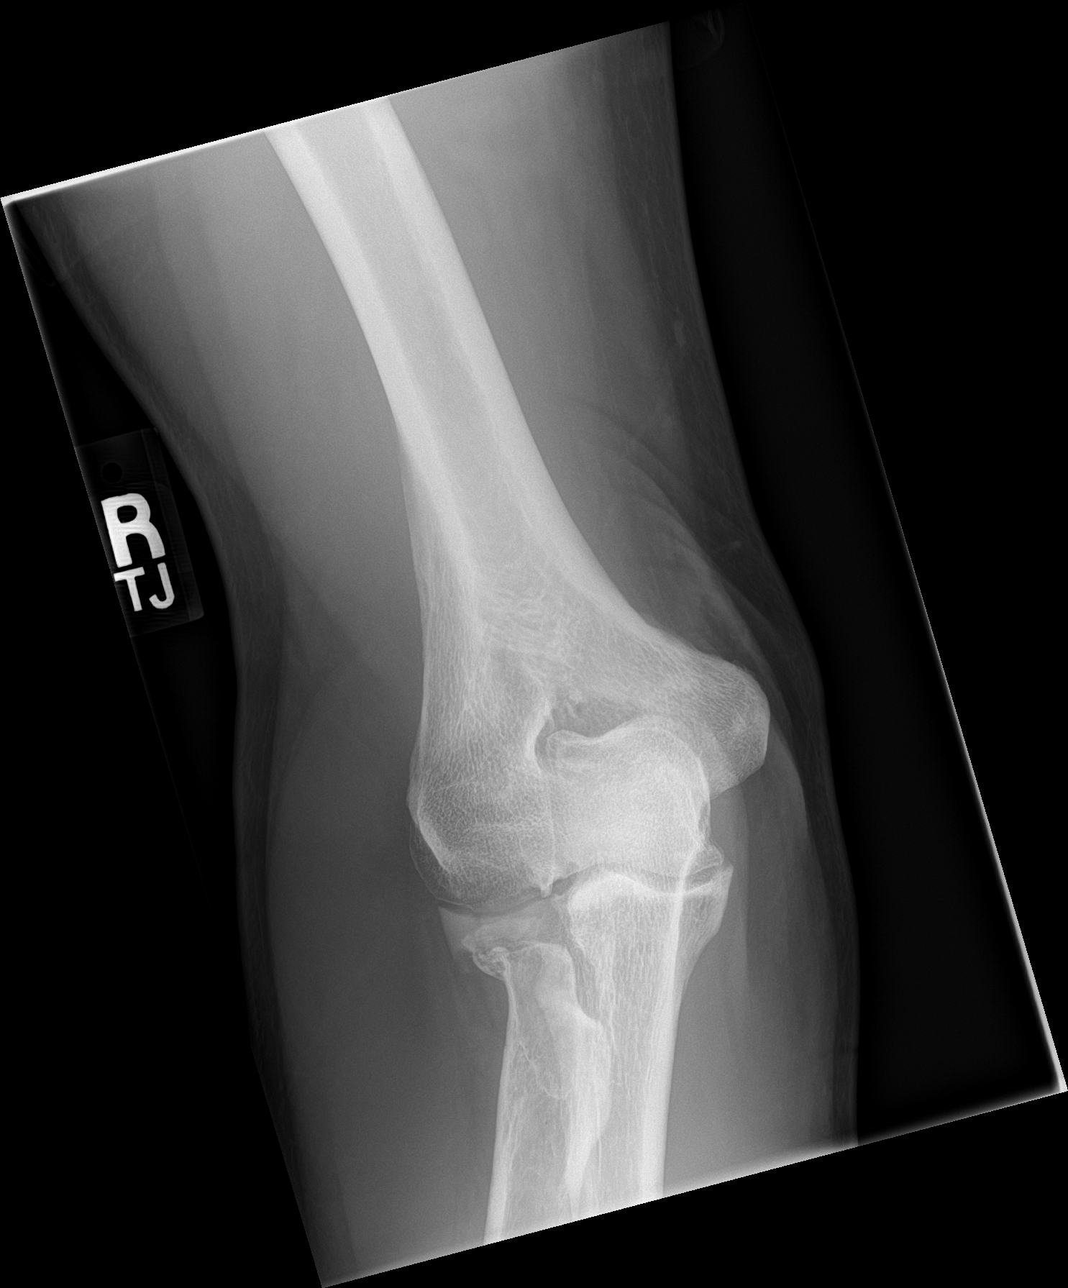

[elbow obl (1 of 2)]
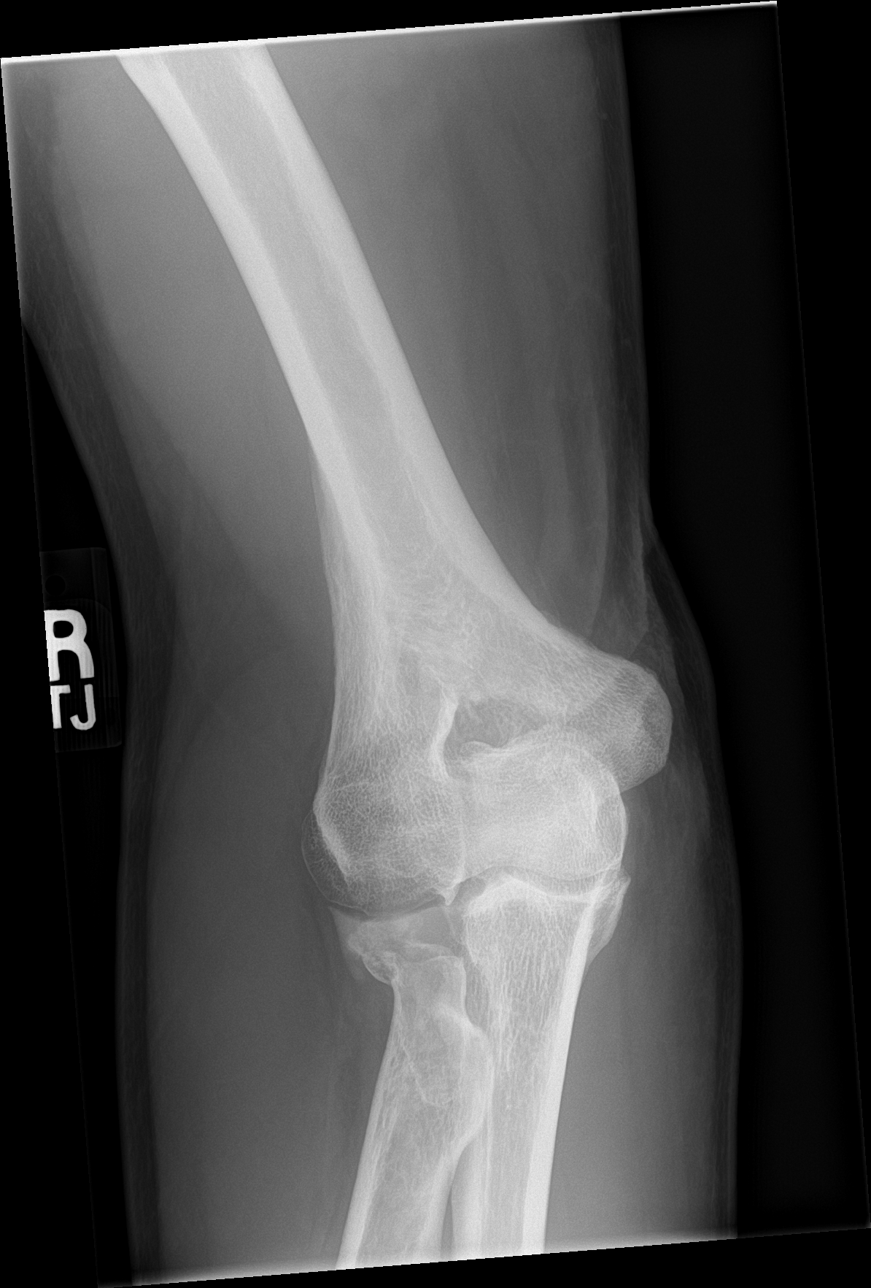

[elbow lat]
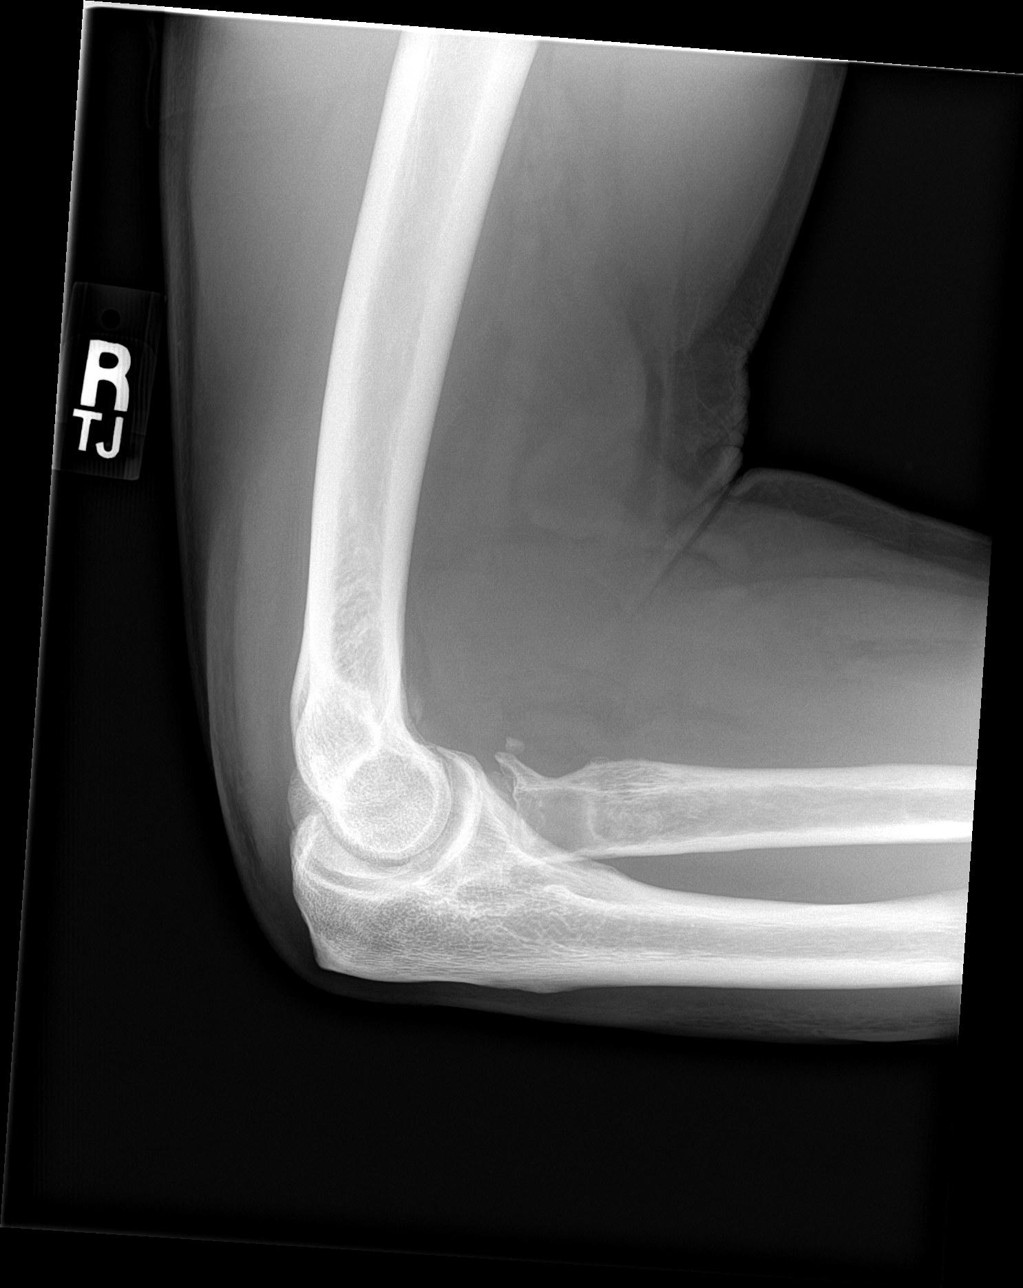

[elbow obl (2 of 2)]
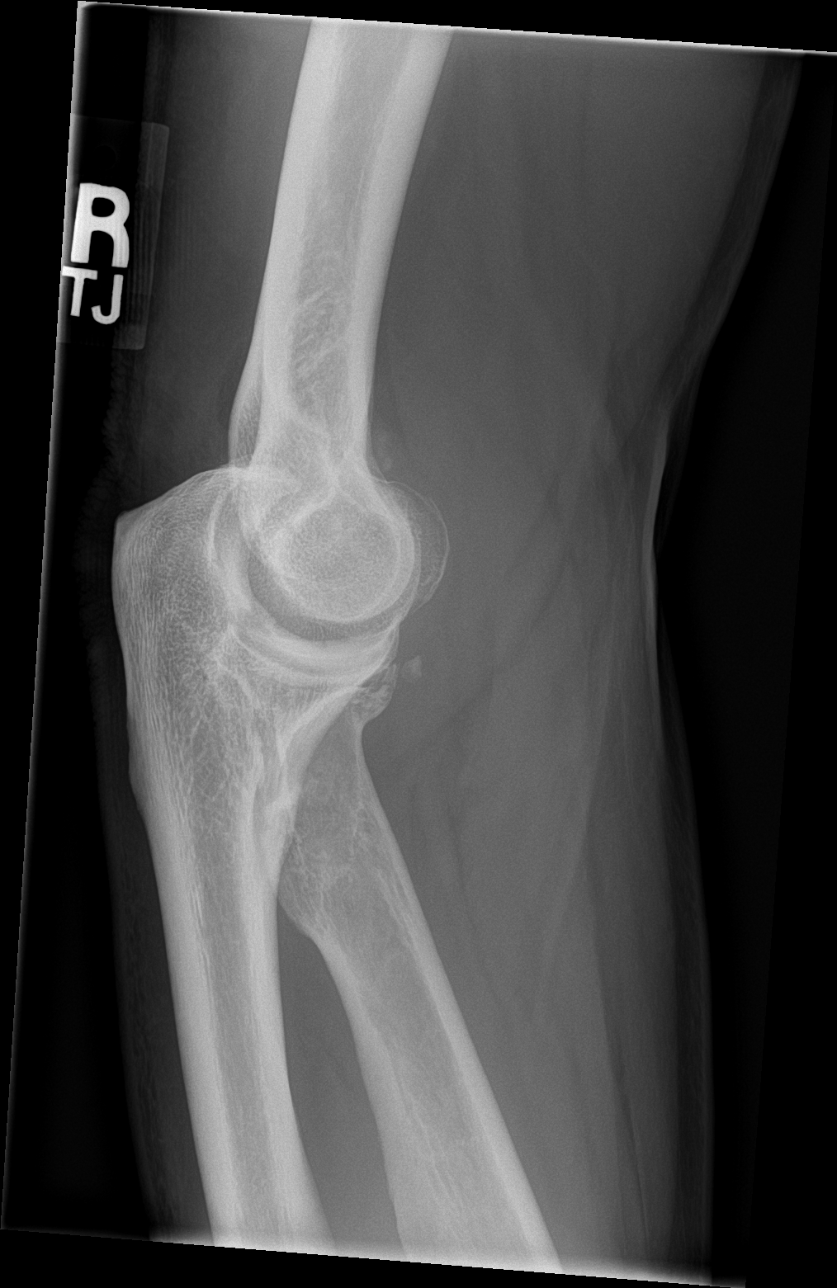

[4 of 4 positions shown; findings below may reference images not displayed]

FINDINGS: Frontal, lateral, and bilateral oblique views obtained.
Postoperative change noted in the proximal radius with prosthesis in
the proximal radial region. There is spurring in this area, stable.
There is a nondisplaced fracture of a spur in the proximal radius
near the postoperative site with alignment near anatomic in this
area. No other evidence of fracture. No dislocation. No appreciable
joint effusion. Mild narrowing is noted in the lateral aspect of the
elbow joint. No erosion. A calcification is noted slightly anterior
to the coracoid process of the proximal ulna.
IMPRESSION: Postoperative change proximal radius with prosthesis in the radial
head region. Nondisplaced fracture through a spur arising laterally
from the area of postoperative change in the proximal radius. No
other evident fracture. No dislocation or joint effusion. Underlying
osteoarthritic change. Intra-articular calcification may be of
postoperative etiology.

These results will be called to the ordering clinician or
representative by the Radiologist Assistant, and communication
documented in the PACS or [REDACTED].

## 2021-07-27 ENCOUNTER — Other Ambulatory Visit: Payer: Self-pay

## 2021-07-27 ENCOUNTER — Other Ambulatory Visit (INDEPENDENT_AMBULATORY_CARE_PROVIDER_SITE_OTHER): Payer: 59

## 2021-07-27 DIAGNOSIS — Z131 Encounter for screening for diabetes mellitus: Secondary | ICD-10-CM

## 2021-07-27 DIAGNOSIS — Z13220 Encounter for screening for lipoid disorders: Secondary | ICD-10-CM | POA: Diagnosis not present

## 2021-07-27 DIAGNOSIS — Z79899 Other long term (current) drug therapy: Secondary | ICD-10-CM

## 2021-07-27 DIAGNOSIS — Z125 Encounter for screening for malignant neoplasm of prostate: Secondary | ICD-10-CM

## 2021-07-27 LAB — CBC WITH DIFFERENTIAL/PLATELET
Basophils Absolute: 0 K/uL (ref 0.0–0.1)
Basophils Relative: 0.4 % (ref 0.0–3.0)
Eosinophils Absolute: 0.2 K/uL (ref 0.0–0.7)
Eosinophils Relative: 3.4 % (ref 0.0–5.0)
HCT: 41.6 % (ref 39.0–52.0)
Hemoglobin: 14.1 g/dL (ref 13.0–17.0)
Lymphocytes Relative: 34.6 % (ref 12.0–46.0)
Lymphs Abs: 1.9 K/uL (ref 0.7–4.0)
MCHC: 34 g/dL (ref 30.0–36.0)
MCV: 89.3 fl (ref 78.0–100.0)
Monocytes Absolute: 0.3 K/uL (ref 0.1–1.0)
Monocytes Relative: 5.3 % (ref 3.0–12.0)
Neutro Abs: 3.2 K/uL (ref 1.4–7.7)
Neutrophils Relative %: 56.3 % (ref 43.0–77.0)
Platelets: 211 K/uL (ref 150.0–400.0)
RBC: 4.66 Mil/uL (ref 4.22–5.81)
RDW: 13 % (ref 11.5–15.5)
WBC: 5.6 K/uL (ref 4.0–10.5)

## 2021-07-27 LAB — LIPID PANEL
Cholesterol: 234 mg/dL — ABNORMAL HIGH (ref 0–200)
HDL: 49.3 mg/dL (ref >39.00)
LDL Cholesterol: 165 mg/dL — ABNORMAL HIGH (ref 0–99)
NonHDL: 185.03
Total CHOL/HDL Ratio: 5
Triglycerides: 100 mg/dL (ref 0.0–149.0)
VLDL: 20 mg/dL (ref 0.0–40.0)

## 2021-07-27 LAB — BASIC METABOLIC PANEL
BUN: 18 mg/dL (ref 6–23)
CO2: 31 mEq/L (ref 19–32)
Calcium: 9.5 mg/dL (ref 8.4–10.5)
Chloride: 101 mEq/L (ref 96–112)
Creatinine, Ser: 0.91 mg/dL (ref 0.40–1.50)
GFR: 92.38 mL/min (ref >60.00)
Glucose, Bld: 110 mg/dL — ABNORMAL HIGH (ref 70–99)
Potassium: 4.7 mEq/L (ref 3.5–5.1)
Sodium: 140 mEq/L (ref 135–145)

## 2021-07-27 LAB — HEMOGLOBIN A1C: Hgb A1c MFr Bld: 6 % (ref 4.6–6.5)

## 2021-07-27 LAB — HEPATIC FUNCTION PANEL
ALT: 20 U/L (ref 0–53)
AST: 17 U/L (ref 0–37)
Albumin: 4.8 g/dL (ref 3.5–5.2)
Alkaline Phosphatase: 34 U/L — ABNORMAL LOW (ref 39–117)
Bilirubin, Direct: 0.1 mg/dL (ref 0.0–0.3)
Total Bilirubin: 0.5 mg/dL (ref 0.2–1.2)
Total Protein: 7.3 g/dL (ref 6.0–8.3)

## 2021-07-28 LAB — PSA, TOTAL WITH REFLEX TO PSA, FREE: PSA, Total: 0.8 ng/mL (ref <=4.0)

## 2021-08-03 ENCOUNTER — Ambulatory Visit (INDEPENDENT_AMBULATORY_CARE_PROVIDER_SITE_OTHER): Payer: 59 | Admitting: Family Medicine

## 2021-08-03 ENCOUNTER — Encounter: Payer: Self-pay | Admitting: Family Medicine

## 2021-08-03 ENCOUNTER — Other Ambulatory Visit: Payer: Self-pay

## 2021-08-03 VITALS — BP 130/80 | HR 63 | Temp 98.4°F | Ht 70.0 in | Wt 211.8 lb

## 2021-08-03 DIAGNOSIS — M7502 Adhesive capsulitis of left shoulder: Secondary | ICD-10-CM

## 2021-08-03 DIAGNOSIS — M25512 Pain in left shoulder: Secondary | ICD-10-CM

## 2021-08-03 DIAGNOSIS — M25511 Pain in right shoulder: Secondary | ICD-10-CM | POA: Diagnosis not present

## 2021-08-03 DIAGNOSIS — M7501 Adhesive capsulitis of right shoulder: Secondary | ICD-10-CM

## 2021-08-03 DIAGNOSIS — G4733 Obstructive sleep apnea (adult) (pediatric): Secondary | ICD-10-CM

## 2021-08-03 DIAGNOSIS — Z0000 Encounter for general adult medical examination without abnormal findings: Secondary | ICD-10-CM

## 2021-08-03 DIAGNOSIS — J309 Allergic rhinitis, unspecified: Secondary | ICD-10-CM

## 2021-08-03 MED ORDER — FLUTICASONE PROPIONATE 50 MCG/ACT NA SUSP: NASAL | 3 refills | Status: DC

## 2021-08-03 MED ORDER — LOSARTAN POTASSIUM-HCTZ 50-12.5 MG PO TABS: 0.5000 | ORAL_TABLET | Freq: Every day | ORAL | 3 refills | Status: DC
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
## 2021-08-04 ENCOUNTER — Encounter: Payer: Self-pay | Admitting: Family Medicine

## 2021-08-11 ENCOUNTER — Encounter: Payer: Self-pay | Admitting: *Deleted

## 2021-09-11 ENCOUNTER — Telehealth: Payer: Self-pay

## 2021-09-11 NOTE — Telephone Encounter (Signed)
Called and lvm in regards to upcoming appt, please have patient bring SD to appointment if patient returns call.

## 2021-09-14 ENCOUNTER — Other Ambulatory Visit: Payer: Self-pay

## 2021-09-14 ENCOUNTER — Encounter: Payer: Self-pay | Admitting: Primary Care

## 2021-09-14 ENCOUNTER — Ambulatory Visit (INDEPENDENT_AMBULATORY_CARE_PROVIDER_SITE_OTHER): Payer: 59 | Admitting: Primary Care

## 2021-09-14 VITALS — BP 126/80 | HR 72 | Temp 98.0°F | Ht 70.0 in | Wt 220.2 lb

## 2021-09-14 DIAGNOSIS — G4733 Obstructive sleep apnea (adult) (pediatric): Secondary | ICD-10-CM | POA: Insufficient documentation

## 2021-09-14 DIAGNOSIS — Z9989 Dependence on other enabling machines and devices: Secondary | ICD-10-CM

## 2021-09-14 NOTE — Patient Instructions (Addendum)
Unfortunately we called sleep med [place] and they do not have record of sleep study from 10 years ago. If you are able to locate these and get [place] a copy we can forgo repeating sleep study, otherwise they need undated sleep testing before proceeding with getting you a new machine   Orders: Home sleep study re: OSA  Follow-up: Virtual visit in 6 weeks  1 year with APP in [place]

## 2021-09-14 NOTE — Assessment & Plan Note (Addendum)
-   Patient has a history of sleep apnea, on CPAP > 10 years. Needs new machine. No record of sleep study from 2011, sleep med in [place] does not have copy. He will need repeat sleep study if unable to locate. Epworth 0/24. He previously had symptoms of snoring, choking and restless sleep.  He is compliant with CPAP and reports benefit from use. Current pressure 4-20cm h20; residual AHI 1.5. He uses full nasal mask. No DME company. Follow-up in 6 week televisit to review sleep study and place DME order for new CPAP machine if indicated.

## 2021-09-14 NOTE — Telephone Encounter (Signed)
Called patient in regards to ask him if it was possible that he bring an SD card with him to his office visit. Unable to leave voicemail nothing further needed.

## 2021-09-14 NOTE — Progress Notes (Signed)
@Patient  ID: , male    DOB: 1961-05-26, 60 y.o.   MRN: [id number]  No chief complaint on file.   Referring provider: [id number], MD  HPI: 60 year old male, former smoker quit 1993.  Past medical history significant for hypertension, TIA, allergic rhinitis, GERD, hyperlipidemia, generalized anxiety disorder, depression.  09/14/2021- Patient presents today for sleep consult.  Patient has a history of sleep apnea, he has been on CPAP > 10 years.  Initial sleep study was done sleep med in [place] in 2011 (results not available in chart). He gets his CPAP supplies online. He uses full nasal mask. He gets on average 9 hours of sleep a night. No issues falling asleep. He is waking up several times a night d/t bilateral arm discomfort. He has been getting physical therapy and will be seeing [facility] for follow-up. He gets DOT exam annually. Epworth score 0.    Airview download 08/16/21-09/14/21:  30/30 days (100%) used; 30 days (100%) > 4 hours Average usage 9 hours 19 mins Pressure 4-20cm h20 (8.6cm h20- 95%) Airleaks 19.5L/min  AHI 1.5    No Known Allergies  Immunization History  Administered Date(s) Administered   Tdap 03/25/2014    Past Medical History:  Diagnosis Date   ALLERGIC RHINITIS 06/04/2009   ANXIETY 06/04/2009   DEPRESSION 06/04/2009   GERD 06/04/2009   HYPERLIPIDEMIA 07/07/2009   Sleep apnea    uses cpap   TIA (transient ischemic attack) 11/17/2020    Tobacco History: Social History   Tobacco Use  Smoking Status Former   Types: Cigarettes   Quit date: 08/24/1992   Years since quitting: 29.0  Smokeless Tobacco Never   Counseling given: Not Answered   Outpatient Medications Prior to Visit  Medication Sig Dispense Refill   fluticasone (FLONASE) 50 MCG/ACT nasal spray SPRAY TWO SPRAYS INTO BOTH NOSTRILS DAILY AS NEEDED 48 g 3   loratadine (CLARITIN) 10 MG tablet Take 10 mg by mouth daily as needed.       losartan-hydrochlorothiazide (HYZAAR) 50-12.5 MG tablet Take 0.5 tablets by mouth daily. 45 tablet 3   omeprazole (PRILOSEC) 20 MG capsule TAKE 1 CAPSULE BY MOUTH  DAILY 90 capsule 3   sertraline (ZOLOFT) 100 MG tablet TAKE 1 AND 1/2 TABLETS BY  MOUTH DAILY 135 tablet 3   simvastatin (ZOCOR) 40 MG tablet TAKE 1 TABLET BY MOUTH  DAILY 90 tablet 3   No facility-administered medications prior to visit.    Review of Systems  Review of Systems  Constitutional: Negative.   Respiratory: Negative.      Physical Exam  BP 126/80 (BP Location: Left Arm, Patient Position: Sitting, Cuff Size: Normal)   Pulse 72   Temp 98 F (36.7 C) (Oral)   Ht 5\' 10"  (1.778 m)   Wt 220 lb 3.2 oz (99.9 kg)   SpO2 98%   BMI 31.60 kg/m  Physical Exam Constitutional:      Appearance: Normal appearance.  HENT:     Head: Normocephalic and atraumatic.     Mouth/Throat:     Mouth: Mucous membranes are moist.     Pharynx: Oropharynx is clear.  Cardiovascular:     Rate and Rhythm: Normal rate and regular rhythm.  Pulmonary:     Effort: Pulmonary effort is normal.     Breath sounds: Normal breath sounds.  Musculoskeletal:        General: Normal range of motion.  Skin:    General: Skin is warm and dry.  Neurological:  General: No focal deficit present.     Mental Status: He is alert and oriented to person, place, and time. Mental status is at baseline.  Psychiatric:        Mood and Affect: Mood normal.        Behavior: Behavior normal.        Thought Content: Thought content normal.        Judgment: Judgment normal.     Lab Results:  CBC    Component Value Date/Time   WBC 5.6 07/27/2021 0954   RBC 4.66 07/27/2021 0954   HGB 14.1 07/27/2021 0954   HGB 15.5 04/17/2014 2220   HCT 41.6 07/27/2021 0954   HCT 46.8 04/17/2014 2220   PLT 211.0 07/27/2021 0954   PLT 207 04/17/2014 2220   MCV 89.3 07/27/2021 0954   MCV 90 04/17/2014 2220   MCH 29.7 04/17/2014 2220   MCHC 34.0 07/27/2021 0954    RDW 13.0 07/27/2021 0954   RDW 13.1 04/17/2014 2220   LYMPHSABS 1.9 07/27/2021 0954   MONOABS 0.3 07/27/2021 0954   EOSABS 0.2 07/27/2021 0954   BASOSABS 0.0 07/27/2021 0954    BMET    Component Value Date/Time   NA 140 07/27/2021 0954   NA 135 (L) 04/17/2014 2220   K 4.7 07/27/2021 0954   K 4.1 04/17/2014 2220   CL 101 07/27/2021 0954   CL 102 04/17/2014 2220   CO2 31 07/27/2021 0954   CO2 30 04/17/2014 2220   GLUCOSE 110 (H) 07/27/2021 0954   GLUCOSE 122 (H) 04/17/2014 2220   BUN 18 07/27/2021 0954   BUN 17 04/17/2014 2220   CREATININE 0.91 07/27/2021 0954   CREATININE 0.94 04/17/2014 2220   CALCIUM 9.5 07/27/2021 0954   CALCIUM 9.2 04/17/2014 2220   GFRNONAA >60 04/17/2014 2220   GFRAA >60 04/17/2014 2220    BNP No results found for: BNP  ProBNP No results found for: PROBNP  Imaging: No results found.   Assessment & Plan:   OSA on CPAP - Patient has a history of sleep apnea, on CPAP > 10 years. Needs new machine. No record of sleep study from 2011, sleep med in [place] does not have copy. He will need repeat sleep study if unable to locate. Epworth 0/24. He previously had symptoms of snoring, choking and restless sleep.  He is compliant with CPAP and reports benefit from use. Current pressure 4-20cm h20; residual AHI 1.5. He uses full nasal mask. No DME company. Follow-up in 6 week televisit to review sleep study and place DME order for new CPAP machine if indicated.    Glenford Bayley, NP 09/14/2021

## 2021-09-15 NOTE — Progress Notes (Signed)
Reviewed and agree with assessment/plan.   Rehmat Murtagh, MD [facility] 09/15/2021, 8:31 AM Pager:  [phone]  

## 2021-09-18 NOTE — Telephone Encounter (Signed)
Created in error

## 2021-09-21 ENCOUNTER — Ambulatory Visit: Payer: 59 | Admitting: Family Medicine

## 2021-09-27 ENCOUNTER — Other Ambulatory Visit: Payer: Self-pay | Admitting: Family Medicine

## 2021-09-27 DIAGNOSIS — F411 Generalized anxiety disorder: Secondary | ICD-10-CM

## 2021-09-27 DIAGNOSIS — K219 Gastro-esophageal reflux disease without esophagitis: Secondary | ICD-10-CM

## 2021-09-27 DIAGNOSIS — E785 Hyperlipidemia, unspecified: Secondary | ICD-10-CM

## 2021-10-26 ENCOUNTER — Ambulatory Visit: Payer: 59

## 2021-10-26 ENCOUNTER — Other Ambulatory Visit: Payer: Self-pay

## 2021-10-26 DIAGNOSIS — G4733 Obstructive sleep apnea (adult) (pediatric): Secondary | ICD-10-CM

## 2021-10-28 DIAGNOSIS — G4733 Obstructive sleep apnea (adult) (pediatric): Secondary | ICD-10-CM

## 2021-11-05 NOTE — Telephone Encounter (Signed)
Patient is requesting HST results.  Beth, please advise. thanks

## 2021-11-05 NOTE — Telephone Encounter (Signed)
I can asked that he set up a virtual visit in 6 weeks to review HST results. HST showed severe OSA. Please set this up visit to review results and discuss treatment options in more detail.

## 2021-11-06 ENCOUNTER — Encounter: Payer: Self-pay | Admitting: Primary Care

## 2021-11-06 ENCOUNTER — Telehealth (INDEPENDENT_AMBULATORY_CARE_PROVIDER_SITE_OTHER): Payer: 59 | Admitting: Primary Care

## 2021-11-06 DIAGNOSIS — Z9989 Dependence on other enabling machines and devices: Secondary | ICD-10-CM

## 2021-11-06 DIAGNOSIS — G4733 Obstructive sleep apnea (adult) (pediatric): Secondary | ICD-10-CM | POA: Diagnosis not present

## 2021-11-06 NOTE — Progress Notes (Signed)
Virtual Visit via Video Note  I connected with Erik [name] on 11/06/21 at  9:30 AM EST by a video enabled telemedicine application and verified that I am speaking with the correct person using two identifiers.  Location: Patient: Home/ In Car  Provider: Office   I discussed the limitations of evaluation and management by telemedicine and the availability of in person appointments. The patient expressed understanding and agreed to proceed.  History of Present Illness: 60 year old male, former smoker quit 1993.  Past medical history significant for hypertension, TIA, allergic rhinitis, GERD, hyperlipidemia, generalized anxiety disorder, depression.  Previous [facility] pulmonary encounter:  09/14/2021- Patient presents today for sleep consult.  Patient has a history of sleep apnea, he has been on CPAP > 25 years. [place] He had sleep study with [facility] in [place] in 2011 (results not available in chart). He gets his CPAP supplies online. He uses full nasal mask. He gets on average 9 hours of sleep a night. No issues falling asleep. He is waking up several times a night d/t bilateral arm discomfort. He has been getting physical therapy and will be seeing [facility] for follow-up. He gets DOT exam annually. Epworth score 0.   Airview download 08/16/21-09/14/21:  30/30 days (100%) used; 30 days (100%) > 4 hours Average usage 9 hours 19 mins Pressure 4-20cm h20 (8.6cm h20- 95%) Airleaks 19.5L/min  AHI 1.5   11/06/2021- Interim hx  Patient contacted today for virtual video visit. During our last visit we did not have previous sleep study results available. In order to get new CPAP machine patient needed to repeat sleep study. He underwent HST on 10/26/21 which showed severe obstructive sleep apnea, AHI 49.4/hr with SpO2 low 86%. Patient currently has a working machine but it is >31 year old and needs order placed for new CPAP. We will set him up with local  DME company as well to get supplies.    Observations/Objective:  - Appears well, no overt shortness of breath, wheezing or cough  Assessment and Plan:  Severe OSA:  - Long standing hx sleep apnea. He is on CPAP, due for new machine. HST on 10/26/21 confirmed dx OSA, AHI 49.4/hr with SpO2 low 86%. We will place DME order for new CPAP machine auto titrate 4-20cm h20 with mask of choice, humidification, supplies and enrol in [place]. Patient should aim to wear CPAP every night for 4-6 hour or longer. Encourage weight loss efforts and advised not to drive if experiencing excessive daytime sleepiness/fatigue.   Follow Up Instructions:  - FU in 6 months or sooner if needed    I discussed the assessment and treatment plan with the patient. The patient was provided an opportunity to ask questions and all were answered. The patient agreed with the plan and demonstrated an understanding of the instructions.   The patient was advised to call back or seek an in-person evaluation if the symptoms worsen or if the condition fails to improve as anticipated.  I provided 18 minutes of non-face-to-face time during this encounter.   Glenford Bayley, NP

## 2021-11-06 NOTE — Patient Instructions (Addendum)
Recommendations: - Aim to wear CPAP every night for 4-6 hours or longer - Do not drive if experiencing excessive daytime sleepiness/fatigue, avoid drinking alcohol in excess or take sedating medication prior to bedtime unless prescribed by physician as these can worsen underlying sleep apnea  - Maintain healthy weight  Orders: - New CPAP machine auto titrate 4-20cm h20, mask of choice, humidification, supplies and enroll in airview   Follow-up: - 6 months in [place]   Sleep Apnea Sleep apnea affects breathing during sleep. It causes breathing to stop for 10 seconds or more, or to become shallow. People with sleep apnea usually snore loudly. It can also increase the risk of: Heart attack. Stroke. Being very overweight (obese). Diabetes. Heart failure. Irregular heartbeat. High blood pressure. The goal of treatment is to help you breathe normally again. What are the causes? The most common cause of this condition is a collapsed or blocked airway. There are three kinds of sleep apnea: Obstructive sleep apnea. This is caused by a blocked or collapsed airway. Central sleep apnea. This happens when the brain does not send the right signals to the muscles that control breathing. Mixed sleep apnea. This is a combination of obstructive and central sleep apnea. What increases the risk? Being overweight. Smoking. Having a small airway. Being older. Being male. Drinking alcohol. Taking medicines to calm yourself (sedatives or tranquilizers). Having family members with the condition. Having a tongue or tonsils that are larger than normal. What are the signs or symptoms? Trouble staying asleep. Loud snoring. Headaches in the morning. Waking up gasping. Dry mouth or sore throat in the morning. Being sleepy or tired during the day. If you are sleepy or tired during the day, you may also: Not be able to focus your mind (concentrate). Forget things. Get angry a lot and have mood  swings. Feel sad (depressed). Have changes in your personality. Have less interest in sex, if you are male. Be unable to have an erection, if you are male. How is this treated?  Sleeping on your side. Using a medicine to get rid of mucus in your nose (decongestant). Avoiding the use of alcohol, medicines to help you relax, or certain pain medicines (narcotics). Losing weight, if needed. Changing your diet. Quitting smoking. Using a machine to open your airway while you sleep, such as: An oral appliance. This is a mouthpiece that shifts your lower jaw forward. A CPAP device. This device blows air through a mask when you breathe out (exhale). An EPAP device. This has valves that you put in each nostril. A BIPAP device. This device blows air through a mask when you breathe in (inhale) and breathe out. Having surgery if other treatments do not work. Follow these instructions at home: Lifestyle Make changes that your doctor recommends. Eat a healthy diet. Lose weight if needed. Avoid alcohol, medicines to help you relax, and some pain medicines. Do not smoke or use any products that contain nicotine or tobacco. If you need help quitting, ask your doctor. General instructions Take over-the-counter and prescription medicines only as told by your doctor. If you were given a machine to use while you sleep, use it only as told by your doctor. If you are having surgery, make sure to tell your doctor you have sleep apnea. You may need to bring your device with you. Keep all follow-up visits. Contact a doctor if: The machine that you were given to use during sleep bothers you or does not seem to be working. You  do not get better. You get worse. Get help right away if: Your chest hurts. You have trouble breathing in enough air. You have an uncomfortable feeling in your back, arms, or stomach. You have trouble talking. One side of your body feels weak. A part of your face is hanging  down. These symptoms may be an emergency. Get help right away. Call your local emergency services (911 in the [place] Do not wait to see if the symptoms will go away. Do not drive yourself to the hospital. Summary This condition affects breathing during sleep. The most common cause is a collapsed or blocked airway. The goal of treatment is to help you breathe normally while you sleep. This information is not intended to replace advice given to you by your health care provider. Make sure you discuss any questions you have with your health care provider. Document Revised: 07/15/2021 Document Reviewed: 11/14/2020 Elsevier Patient Education  2022 [employer] Inc[employer]   CPAP and BIPAP Information CPAP and BIPAP are methods that use air pressure to keep your airways open and to help you breathe well. CPAP and BIPAP use different amounts of pressure. Your health care provider will tell you whether CPAP or BIPAP would be more helpful for you. CPAP stands for "continuous positive airway pressure." With CPAP, the amount of pressure stays the same while you breathe in (inhale) and out (exhale). BIPAP stands for "bi-level positive airway pressure." With BIPAP, the amount of pressure will be higher when you inhale and lower when you exhale. This allows you to take larger breaths. CPAP or BIPAP may be used in the hospital, or your health care provider may want you to use it at home. You may need to have a sleep study before your health care provider can order a machine for you to use at home. What are the advantages? CPAP or BIPAP can be helpful if you have: Sleep apnea. Chronic obstructive pulmonary disease (COPD). Heart failure. Medical conditions that cause muscle weakness, including muscular dystrophy or amyotrophic lateral sclerosis (ALS). Other problems that cause breathing to be shallow, weak, abnormal, or difficult. CPAP and BIPAP are most commonly used for obstructive sleep apnea (OSA) to keep the  airways from collapsing when the muscles relax during sleep. What are the risks? Generally, this is a safe treatment. However, problems may occur, including: Irritated skin or skin sores if the mask does not fit properly. Dry or stuffy nose or nosebleeds. Dry mouth. Feeling gassy or bloated. Sinus or lung infection if the equipment is not cleaned properly. When should CPAP or BIPAP be used? In most cases, the mask only needs to be worn during sleep. Generally, the mask needs to be worn throughout the night and during any daytime naps. People with certain medical conditions may also need to wear the mask at other times, such as when they are awake. Follow instructions from your health care provider about when to use the machine. What happens during CPAP or BIPAP? Both CPAP and BIPAP are provided by a small machine with a flexible plastic tube that attaches to a plastic mask that you wear. Air is blown through the mask into your nose or mouth. The amount of pressure that is used to blow the air can be adjusted on the machine. Your health care provider will set the pressure setting and help you find the best mask for you. Tips for using the mask Because the mask needs to be snug, some people feel trapped or closed-in (claustrophobic) when  first using the mask. If you feel this way, you may need to get used to the mask. One way to do this is to hold the mask loosely over your nose or mouth and then gradually apply the mask more snugly. You can also gradually increase the amount of time that you use the mask. Masks are available in various types and sizes. If your mask does not fit well, talk with your health care provider about getting a different one. Some common types of masks include: Full face masks, which fit over the mouth and nose. Nasal masks, which fit over the nose. Nasal pillow or prong masks, which fit into the nostrils. If you are using a mask that fits over your nose and you tend to  breathe through your mouth, a chin strap may be applied to help keep your mouth closed. Use a skin barrier to protect your skin as told by your health care provider. Some CPAP and BIPAP machines have alarms that may sound if the mask comes off or develops a leak. If you have trouble with the mask, it is very important that you talk with your health care provider about finding a way to make the mask easier to tolerate. Do not stop using the mask. There could be a negative impact on your health if you stop using the mask. Tips for using the machine Place your CPAP or BIPAP machine on a secure table or stand near an electrical outlet. Know where the on/off switch is on the machine. Follow instructions from your health care provider about how to set the pressure on your machine and when you should use it. Do not eat or drink while the CPAP or BIPAP machine is on. Food or fluids could get pushed into your lungs by the pressure of the CPAP or BIPAP. For home use, CPAP and BIPAP machines can be rented or purchased through home health care companies. Many different brands of machines are available. Renting a machine before purchasing may help you find out which particular machine works well for you. Your health insurance company may also decide which machine you may get. Keep the CPAP or BIPAP machine and attachments clean. Ask your health care provider for specific instructions. Check the humidifier if you have a dry stuffy nose or nosebleeds. Make sure it is working correctly. Follow these instructions at home: Take over-the-counter and prescription medicines only as told by your health care provider. Ask if you can take sinus medicine if your sinuses are blocked. Do not use any products that contain nicotine or tobacco. These products include cigarettes, chewing tobacco, and vaping devices, such as e-cigarettes. If you need help quitting, ask your health care provider. Keep all follow-up visits. This is  important. Contact a health care provider if: You have redness or pressure sores on your head, face, mouth, or nose from the mask or head gear. You have trouble using the CPAP or BIPAP machine. You cannot tolerate wearing the CPAP or BIPAP mask. Someone tells you that you snore even when wearing your CPAP or BIPAP. Get help right away if: You have trouble breathing. You feel confused. Summary CPAP and BIPAP are methods that use air pressure to keep your airways open and to help you breathe well. If you have trouble with the mask, it is very important that you talk with your health care provider about finding a way to make the mask easier to tolerate. Do not stop using the mask. There could be  a negative impact to your health if you stop using the mask. Follow instructions from your health care provider about when to use the machine. This information is not intended to replace advice given to you by your health care provider. Make sure you discuss any questions you have with your health care provider. Document Revised: 07/15/2021 Document Reviewed: 11/14/2020 Elsevier Patient Education  2022 [employer].

## 2021-11-06 NOTE — Progress Notes (Signed)
Reviewed and agree with assessment/plan.   Coralyn Helling, MD [facility] Pulmonary/Critical Care 11/06/2021, 1:23 PM Pager:  [phone]

## 2022-04-19 ENCOUNTER — Other Ambulatory Visit: Payer: Self-pay | Admitting: *Deleted

## 2022-04-19 DIAGNOSIS — E785 Hyperlipidemia, unspecified: Secondary | ICD-10-CM

## 2022-04-19 DIAGNOSIS — F411 Generalized anxiety disorder: Secondary | ICD-10-CM

## 2022-04-19 DIAGNOSIS — K219 Gastro-esophageal reflux disease without esophagitis: Secondary | ICD-10-CM

## 2022-04-19 DIAGNOSIS — J309 Allergic rhinitis, unspecified: Secondary | ICD-10-CM

## 2022-04-19 MED ORDER — SIMVASTATIN 40 MG PO TABS: 40.0000 mg | ORAL_TABLET | Freq: Every day | ORAL | 0 refills | Status: DC

## 2022-04-19 MED ORDER — SERTRALINE HCL 100 MG PO TABS: 150.0000 mg | ORAL_TABLET | Freq: Every day | ORAL | 0 refills | Status: DC

## 2022-04-19 MED ORDER — FLUTICASONE PROPIONATE 50 MCG/ACT NA SUSP
NASAL | 0 refills | Status: DC
Start: 2022-04-19 — End: 2022-08-27

## 2022-04-19 MED ORDER — OMEPRAZOLE 20 MG PO CPDR: 20.0000 mg | DELAYED_RELEASE_CAPSULE | Freq: Every day | ORAL | 0 refills | Status: DC

## 2022-05-20 ENCOUNTER — Other Ambulatory Visit: Payer: Self-pay | Admitting: Family Medicine

## 2022-07-29 ENCOUNTER — Other Ambulatory Visit: Payer: Self-pay | Admitting: Family Medicine

## 2022-07-29 DIAGNOSIS — F411 Generalized anxiety disorder: Secondary | ICD-10-CM

## 2022-07-29 DIAGNOSIS — K219 Gastro-esophageal reflux disease without esophagitis: Secondary | ICD-10-CM

## 2022-07-29 NOTE — Telephone Encounter (Signed)
Please schedule CPE with fasting labs prior with Dr. Copland.  

## 2022-07-29 NOTE — Telephone Encounter (Signed)
Patient scheduled.

## 2022-08-09 ENCOUNTER — Other Ambulatory Visit: Payer: Self-pay | Admitting: Family Medicine

## 2022-08-09 DIAGNOSIS — E785 Hyperlipidemia, unspecified: Secondary | ICD-10-CM

## 2022-08-09 DIAGNOSIS — Z125 Encounter for screening for malignant neoplasm of prostate: Secondary | ICD-10-CM

## 2022-08-09 DIAGNOSIS — Z79899 Other long term (current) drug therapy: Secondary | ICD-10-CM

## 2022-08-09 DIAGNOSIS — Z131 Encounter for screening for diabetes mellitus: Secondary | ICD-10-CM

## 2022-08-18 ENCOUNTER — Other Ambulatory Visit (INDEPENDENT_AMBULATORY_CARE_PROVIDER_SITE_OTHER): Payer: 59

## 2022-08-18 DIAGNOSIS — Z131 Encounter for screening for diabetes mellitus: Secondary | ICD-10-CM

## 2022-08-18 DIAGNOSIS — Z125 Encounter for screening for malignant neoplasm of prostate: Secondary | ICD-10-CM | POA: Diagnosis not present

## 2022-08-18 DIAGNOSIS — Z79899 Other long term (current) drug therapy: Secondary | ICD-10-CM | POA: Diagnosis not present

## 2022-08-18 DIAGNOSIS — E785 Hyperlipidemia, unspecified: Secondary | ICD-10-CM | POA: Diagnosis not present

## 2022-08-18 LAB — BASIC METABOLIC PANEL
BUN: 19 mg/dL (ref 6–23)
CO2: 30 mEq/L (ref 19–32)
Calcium: 9.7 mg/dL (ref 8.4–10.5)
Chloride: 104 mEq/L (ref 96–112)
Creatinine, Ser: 0.9 mg/dL (ref 0.40–1.50)
GFR: 92.92 mL/min (ref >60.00)
Glucose, Bld: 105 mg/dL — ABNORMAL HIGH (ref 70–99)
Potassium: 4.1 mEq/L (ref 3.5–5.1)
Sodium: 141 mEq/L (ref 135–145)

## 2022-08-18 LAB — LIPID PANEL
Cholesterol: 186 mg/dL (ref 0–200)
HDL: 46.7 mg/dL (ref >39.00)
LDL Cholesterol: 102 mg/dL — ABNORMAL HIGH (ref 0–99)
NonHDL: 139.45
Total CHOL/HDL Ratio: 4
Triglycerides: 186 mg/dL — ABNORMAL HIGH (ref 0.0–149.0)
VLDL: 37.2 mg/dL (ref 0.0–40.0)

## 2022-08-18 LAB — HEPATIC FUNCTION PANEL
ALT: 22 U/L (ref 0–53)
AST: 19 U/L (ref 0–37)
Albumin: 4.9 g/dL (ref 3.5–5.2)
Alkaline Phosphatase: 39 U/L (ref 39–117)
Bilirubin, Direct: 0.1 mg/dL (ref 0.0–0.3)
Total Bilirubin: 0.5 mg/dL (ref 0.2–1.2)
Total Protein: 7.7 g/dL (ref 6.0–8.3)

## 2022-08-18 LAB — CBC WITH DIFFERENTIAL/PLATELET
Basophils Absolute: 0 10*3/uL (ref 0.0–0.1)
Basophils Relative: 0.5 % (ref 0.0–3.0)
Eosinophils Absolute: 0.3 10*3/uL (ref 0.0–0.7)
Eosinophils Relative: 3.7 % (ref 0.0–5.0)
HCT: 40.9 % (ref 39.0–52.0)
Hemoglobin: 14.3 g/dL (ref 13.0–17.0)
Lymphocytes Relative: 31.9 % (ref 12.0–46.0)
Lymphs Abs: 2.3 10*3/uL (ref 0.7–4.0)
MCHC: 34.8 g/dL (ref 30.0–36.0)
MCV: 90.2 fl (ref 78.0–100.0)
Monocytes Absolute: 0.4 10*3/uL (ref 0.1–1.0)
Monocytes Relative: 5.6 % (ref 3.0–12.0)
Neutro Abs: 4.3 10*3/uL (ref 1.4–7.7)
Neutrophils Relative %: 58.3 % (ref 43.0–77.0)
Platelets: 227 10*3/uL (ref 150.0–400.0)
RBC: 4.54 Mil/uL (ref 4.22–5.81)
RDW: 13 % (ref 11.5–15.5)
WBC: 7.3 10*3/uL (ref 4.0–10.5)

## 2022-08-18 LAB — HEMOGLOBIN A1C: Hgb A1c MFr Bld: 6.1 % (ref 4.6–6.5)

## 2022-08-20 LAB — PSA, TOTAL WITH REFLEX TO PSA, FREE: PSA, Total: 1 ng/mL (ref <=4.0)

## 2022-08-24 NOTE — Progress Notes (Unsigned)
Raedyn Klinck T. Peri Kreft, MD, CAQ Sports Medicine [facility] at [facility] [address] [place], [address]  Phone: [phone]  FAX: [phone]  Erik [name] - 61 y.o. male  MRN [id number]  Date of Birth: 08/03/1961  Date: 08/25/2022  PCP: [name], MD  Referral: [name], MD  No chief complaint on file.  Patient Care Team: [name], MD as PCP - General Subjective:   Erik [name] is a 61 y.o. pleasant patient who presents with the following:  Preventative Health Maintenance Visit:  Health Maintenance Summary Reviewed and updated, unless pt declines services.  Tobacco History Reviewed. Alcohol: No concerns, no excessive use Exercise Habits: Some activity, rec at least 30 mins 5 times a week STD concerns: no risk or activity to increase risk Drug Use: None  Covid Shingles Flu  Colonoscopy   Health Maintenance  Topic Date Due   COVID-19 Vaccine (1) Never done   Zoster Vaccines- Shingrix (1 of 2) Never done   INFLUENZA VACCINE  Never done   COLONOSCOPY (Pts 45-48yrs Insurance coverage will need to be confirmed)  10/09/2022   TETANUS/TDAP  03/25/2024   Hepatitis C Screening  Completed   HIV Screening  Completed   HPV VACCINES  Aged Out   Immunization History  Administered Date(s) Administered   Tdap 03/25/2014   Patient Active Problem List   Diagnosis Date Noted   TIA (transient ischemic attack) 11/17/2020    Priority: High   OSA on CPAP 09/14/2021    Priority: Medium    Essential hypertension 03/17/2021    Priority: Medium    Hyperlipidemia LDL goal <70 07/07/2009    Priority: Medium    Generalized anxiety disorder 06/04/2009    Priority: Medium    DEPRESSION 06/04/2009    Priority: Medium    ALLERGIC RHINITIS 06/04/2009    Priority: Low   GERD 06/04/2009    Priority: Low    Past Medical History:  Diagnosis Date   ALLERGIC RHINITIS 06/04/2009   ANXIETY 06/04/2009   DEPRESSION 06/04/2009   GERD  06/04/2009   HYPERLIPIDEMIA 07/07/2009   Sleep apnea    uses cpap   TIA (transient ischemic attack) 11/17/2020    Past Surgical History:  Procedure Laterality Date   ORIF ELBOW FRACTURE  1988   right   UPPER GASTROINTESTINAL ENDOSCOPY  1999   dilatation for stricture    Family History  Problem Relation Age of Onset   Colon cancer Neg Hx    Stomach cancer Neg Hx    Esophageal cancer Neg Hx    Rectal cancer Neg Hx     Social History   Social History Narrative   Not on file    Past Medical History, Surgical History, Social History, Family History, Problem List, Medications, and Allergies have been reviewed and updated if relevant.  Review of Systems: Pertinent positives are listed above.  Otherwise, a full 14 point review of systems has been done in full and it is negative except where it is noted positive.  Objective:   There were no vitals taken for this visit. Ideal Body Weight:    Ideal Body Weight:   No results found.    07/23/2019    9:06 AM 05/01/2018    8:08 AM  Depression screen PHQ 2/9  Decreased Interest 0 0  Down, Depressed, Hopeless 0 0  PHQ - 2 Score 0 0     GEN: well developed, well nourished, no acute distress Eyes: conjunctiva and  lids normal, PERRLA, EOMI ENT: TM clear, nares clear, oral exam WNL Neck: supple, no lymphadenopathy, no thyromegaly, no JVD Pulm: clear to auscultation and percussion, respiratory effort normal CV: regular rate and rhythm, S1-S2, no murmur, rub or gallop, no bruits, peripheral pulses normal and symmetric, no cyanosis, clubbing, edema or varicosities GI: soft, non-tender; no hepatosplenomegaly, masses; active bowel sounds all quadrants GU: deferred Lymph: no cervical, axillary or inguinal adenopathy MSK: gait normal, muscle tone and strength WNL, no joint swelling, effusions, discoloration, crepitus  SKIN: clear, good turgor, color WNL, no rashes, lesions, or ulcerations Neuro: normal mental status, normal strength,  sensation, and motion Psych: alert; oriented to person, place and time, normally interactive and not anxious or depressed in appearance.  All labs reviewed with patient. Results for orders placed or performed in visit on 08/18/22  PSA, Total with Reflex to PSA, Free  Result Value Ref Range   PSA, Total 1.0 < OR = 4.0 ng/mL  Hemoglobin A1c  Result Value Ref Range   Hgb A1c MFr Bld 6.1 4.6 - 6.5 %  CBC with Differential/Platelet  Result Value Ref Range   WBC 7.3 4.0 - 10.5 K/uL   RBC 4.54 4.22 - 5.81 Mil/uL   Hemoglobin 14.3 13.0 - 17.0 g/dL   HCT 74.1 63.8 - 45.3 %   MCV 90.2 78.0 - 100.0 fl   MCHC 34.8 30.0 - 36.0 g/dL   RDW 64.6 80.3 - 21.2 %   Platelets 227.0 150.0 - 400.0 K/uL   Neutrophils Relative % 58.3 43.0 - 77.0 %   Lymphocytes Relative 31.9 12.0 - 46.0 %   Monocytes Relative 5.6 3.0 - 12.0 %   Eosinophils Relative 3.7 0.0 - 5.0 %   Basophils Relative 0.5 0.0 - 3.0 %   Neutro Abs 4.3 1.4 - 7.7 K/uL   Lymphs Abs 2.3 0.7 - 4.0 K/uL   Monocytes Absolute 0.4 0.1 - 1.0 K/uL   Eosinophils Absolute 0.3 0.0 - 0.7 K/uL   Basophils Absolute 0.0 0.0 - 0.1 K/uL  Basic metabolic panel  Result Value Ref Range   Sodium 141 135 - 145 mEq/L   Potassium 4.1 3.5 - 5.1 mEq/L   Chloride 104 96 - 112 mEq/L   CO2 30 19 - 32 mEq/L   Glucose, Bld 105 (H) 70 - 99 mg/dL   BUN 19 6 - 23 mg/dL   Creatinine, Ser 2.48 0.40 - 1.50 mg/dL   GFR 25.00 >37.04 mL/min   Calcium 9.7 8.4 - 10.5 mg/dL  Hepatic function panel  Result Value Ref Range   Total Bilirubin 0.5 0.2 - 1.2 mg/dL   Bilirubin, Direct 0.1 0.0 - 0.3 mg/dL   Alkaline Phosphatase 39 39 - 117 U/L   AST 19 0 - 37 U/L   ALT 22 0 - 53 U/L   Total Protein 7.7 6.0 - 8.3 g/dL   Albumin 4.9 3.5 - 5.2 g/dL  Lipid panel  Result Value Ref Range   Cholesterol 186 0 - 200 mg/dL   Triglycerides 888.9 (H) 0.0 - 149.0 mg/dL   HDL 16.94 >50.38 mg/dL   VLDL 88.2 0.0 - 80.0 mg/dL   LDL Cholesterol 349 (H) 0 - 99 mg/dL   Total CHOL/HDL Ratio  4    NonHDL 139.45     Assessment and Plan:     ICD-10-CM   1. Healthcare maintenance  Z00.00       Health Maintenance Exam: The patient's preventative maintenance and recommended screening tests for an annual wellness  exam were reviewed in full today. Brought up to date unless services declined.  Counselled on the importance of diet, exercise, and its role in overall health and mortality. The patient's FH and SH was reviewed, including their home life, tobacco status, and drug and alcohol status.  Follow-up in 1 year for physical exam or additional follow-up below.  Disposition: No follow-ups on file.  No orders of the defined types were placed in this encounter.  There are no discontinued medications. No orders of the defined types were placed in this encounter.   Signed,  Elpidio Galea. Goran Olden, MD   Allergies as of 08/25/2022   No Known Allergies      Medication List        Accurate as of August 24, 2022  2:02 PM. If you have any questions, ask your nurse or doctor.          fluticasone 50 MCG/ACT nasal spray Commonly known as: FLONASE SPRAY TWO SPRAYS INTO BOTH NOSTRILS DAILY AS NEEDED   loratadine 10 MG tablet Commonly known as: CLARITIN Take 10 mg by mouth daily as needed.   losartan-hydrochlorothiazide 50-12.5 MG tablet Commonly known as: HYZAAR TAKE 1/2 TABLET BY MOUTH EVERY DAY   omeprazole 20 MG capsule Commonly known as: PRILOSEC TAKE 1 CAPSULE BY MOUTH EVERY DAY   sertraline 100 MG tablet Commonly known as: ZOLOFT TAKE 1.5 TABLETS (150MG  TOTAL) BY MOUTH DAILY   simvastatin 40 MG tablet Commonly known as: ZOCOR Take 1 tablet (40 mg total) by mouth daily.

## 2022-08-25 ENCOUNTER — Encounter: Payer: Self-pay | Admitting: Family Medicine

## 2022-08-25 ENCOUNTER — Ambulatory Visit (INDEPENDENT_AMBULATORY_CARE_PROVIDER_SITE_OTHER): Payer: 59 | Admitting: Family Medicine

## 2022-08-25 VITALS — BP 118/78 | HR 75 | Temp 98.0°F | Ht 70.25 in | Wt 227.2 lb

## 2022-08-25 DIAGNOSIS — Z0000 Encounter for general adult medical examination without abnormal findings: Secondary | ICD-10-CM

## 2022-08-25 DIAGNOSIS — Z1211 Encounter for screening for malignant neoplasm of colon: Secondary | ICD-10-CM | POA: Diagnosis not present

## 2022-08-26 ENCOUNTER — Other Ambulatory Visit: Payer: Self-pay | Admitting: Family Medicine

## 2022-08-27 ENCOUNTER — Other Ambulatory Visit: Payer: Self-pay | Admitting: Family Medicine

## 2022-08-27 DIAGNOSIS — J309 Allergic rhinitis, unspecified: Secondary | ICD-10-CM

## 2022-10-14 ENCOUNTER — Encounter: Payer: Self-pay | Admitting: Internal Medicine

## 2022-10-29 ENCOUNTER — Other Ambulatory Visit: Payer: Self-pay | Admitting: Family Medicine

## 2022-10-29 DIAGNOSIS — K219 Gastro-esophageal reflux disease without esophagitis: Secondary | ICD-10-CM

## 2022-11-04 ENCOUNTER — Encounter: Payer: Self-pay | Admitting: Internal Medicine

## 2022-11-18 ENCOUNTER — Ambulatory Visit (AMBULATORY_SURGERY_CENTER): Payer: 59 | Admitting: *Deleted

## 2022-11-18 VITALS — Ht 70.75 in | Wt 232.0 lb

## 2022-11-18 DIAGNOSIS — Z1211 Encounter for screening for malignant neoplasm of colon: Secondary | ICD-10-CM

## 2022-11-18 MED ORDER — NA SULFATE-K SULFATE-MG SULF 17.5-3.13-1.6 GM/177ML PO SOLN: 1.0000 | Freq: Once | ORAL | 0 refills | Status: AC

## 2022-11-18 MED ORDER — NA SULFATE-K SULFATE-MG SULF 17.5-3.13-1.6 GM/177ML PO SOLN: 1.0000 | Freq: Once | ORAL | 0 refills | Status: DC

## 2022-11-18 NOTE — Addendum Note (Signed)
Addended by: Mason Jim on: 11/18/2022 03:45 PM   Modules accepted: Orders

## 2022-11-18 NOTE — Progress Notes (Signed)
No egg or soy allergy known to patient  No issues known to pt with past sedation with any surgeries or procedures Patient denies ever being told they had issues or difficulty with intubation  No FH of Malignant Hyperthermia Pt is not on diet pills Pt is not on  home 02  Pt is not on blood thinners  Pt denies issues with constipation  Pt encouraged to use to use Singlecare or Goodrx to reduce cost  In person Coupon

## 2022-11-19 ENCOUNTER — Encounter: Payer: Self-pay | Admitting: Internal Medicine

## 2022-11-23 ENCOUNTER — Ambulatory Visit (AMBULATORY_SURGERY_CENTER): Payer: 59 | Admitting: Internal Medicine

## 2022-11-23 ENCOUNTER — Encounter: Payer: Self-pay | Admitting: Internal Medicine

## 2022-11-23 VITALS — BP 130/74 | HR 58 | Temp 98.4°F | Resp 13 | Ht 70.25 in | Wt 232.0 lb

## 2022-11-23 DIAGNOSIS — Z1211 Encounter for screening for malignant neoplasm of colon: Secondary | ICD-10-CM

## 2022-11-23 HISTORY — PX: COLONOSCOPY: SHX174

## 2022-11-23 NOTE — Progress Notes (Signed)
Pt's states no medical or surgical changes since previsit or office visit. 

## 2022-11-23 NOTE — Progress Notes (Signed)
A and O x3. Report to RN. Tolerated MAC anesthesia well. 

## 2022-11-23 NOTE — Patient Instructions (Addendum)
Resume previous diet. Continue present medications. Repeat colonoscopy in 10 years for screening purposes.   YOU HAD AN ENDOSCOPIC PROCEDURE TODAY AT THE [facility]:   Refer to the procedure report that was given to you for any specific questions about what was found during the examination.  If the procedure report does not answer your questions, please call your gastroenterologist to clarify.  If you requested that your care partner not be given the details of your procedure findings, then the procedure report has been included in a sealed envelope for you to review at your convenience later.  YOU SHOULD EXPECT: Some feelings of bloating in the abdomen. Passage of more gas than usual.  Walking can help get rid of the air that was put into your GI tract during the procedure and reduce the bloating. If you had a lower endoscopy (such as a colonoscopy or flexible sigmoidoscopy) you may notice spotting of blood in your stool or on the toilet paper. If you underwent a bowel prep for your procedure, you may not have a normal bowel movement for a few days.  Please Note:  You might notice some irritation and congestion in your nose or some drainage.  This is from the oxygen used during your procedure.  There is no need for concern and it should clear up in a day or so.  SYMPTOMS TO REPORT IMMEDIATELY:  Following lower endoscopy (colonoscopy or flexible sigmoidoscopy):  Excessive amounts of blood in the stool  Significant tenderness or worsening of abdominal pains  Swelling of the abdomen that is new, acute  Fever of 100F or higher  For urgent or emergent issues, a gastroenterologist can be reached at any hour by calling [phone]. Do not use MyChart messaging for urgent concerns.    DIET:  We do recommend a small meal at first, but then you may proceed to your regular diet.  Drink plenty of fluids but you should avoid alcoholic beverages for 24 hours.  ACTIVITY:  You should plan  to take it easy for the rest of today and you should NOT DRIVE or use heavy machinery until tomorrow (because of the sedation medicines used during the test).    FOLLOW UP: Our staff will call the number listed on your records the next business day following your procedure.  We will call around 7:15- 8:00 am to check on you and address any questions or concerns that you may have regarding the information given to you following your procedure. If we do not reach you, we will leave a message.     If any biopsies were taken you will be contacted by phone or by letter within the next 1-3 weeks.  Please call us at [phone] if you have not heard about the biopsies in 3 weeks.    SIGNATURES/CONFIDENTIALITY: You and/or your care partner have signed paperwork which will be entered into your electronic medical record.  These signatures attest to the fact that that the information above on your After Visit Summary has been reviewed and is understood.  Full responsibility of the confidentiality of this discharge information lies with you and/or your care-partner. 

## 2022-11-23 NOTE — Progress Notes (Signed)
GASTROENTEROLOGY PROCEDURE H&P NOTE   Primary Care Physician: [name], MD    Reason for Procedure:   Colon cancer screening  Plan:    colonoscopy  Patient is appropriate for endoscopic procedure(s) in the ambulatory ([facility]) setting.  The nature of the procedure, as well as the risks, benefits, and alternatives were carefully and thoroughly reviewed with the patient. Ample time for discussion and questions allowed. The patient understood, was satisfied, and agreed to proceed.     HPI: Erik [name] is a 61 y.o. male who presents for colonoscopy.  Medical history as below.  Tolerated the prep.  No recent chest pain or shortness of breath.  No abdominal pain today.  Past Medical History:  Diagnosis Date   ALLERGIC RHINITIS 06/04/2009   Allergy    ANXIETY 06/04/2009   DEPRESSION 06/04/2009   GERD 06/04/2009   HYPERLIPIDEMIA 07/07/2009   Sleep apnea    uses cpap   TIA (transient ischemic attack) 11/17/2020    Past Surgical History:  Procedure Laterality Date   COLONOSCOPY  11/23/2022   10/09/12-normal   ORIF ELBOW FRACTURE  12/20/1986   right   SHOULDER SURGERY     UPPER GASTROINTESTINAL ENDOSCOPY  12/20/1997   dilatation for stricture    Prior to Admission medications   Medication Sig Start Date End Date Taking? Authorizing Provider  loratadine (CLARITIN) 10 MG tablet Take 10 mg by mouth daily as needed.    Yes [provider]  losartan-hydrochlorothiazide (HYZAAR) 50-12.5 MG tablet TAKE 1/2 TABLET BY MOUTH EVERY DAY 08/26/22  Yes Copland, Karleen Hampshire, MD  omeprazole (PRILOSEC) 20 MG capsule TAKE 1 CAPSULE BY MOUTH EVERY DAY 10/29/22  Yes Copland, Karleen Hampshire, MD  sertraline (ZOLOFT) 100 MG tablet Take 100 mg by mouth daily.   Yes [provider]  simvastatin (ZOCOR) 40 MG tablet Take 1 tablet (40 mg total) by mouth daily. 04/19/22  Yes Copland, Karleen Hampshire, MD  fluticasone (FLONASE) 50 MCG/ACT nasal spray SPRAY TWO SPRAYS INTO BOTH NOSTRILS DAILY AS NEEDED  08/27/22   Copland, Karleen Hampshire, MD    Current Outpatient Medications  Medication Sig Dispense Refill   loratadine (CLARITIN) 10 MG tablet Take 10 mg by mouth daily as needed.      losartan-hydrochlorothiazide (HYZAAR) 50-12.5 MG tablet TAKE 1/2 TABLET BY MOUTH EVERY DAY 45 tablet 3   omeprazole (PRILOSEC) 20 MG capsule TAKE 1 CAPSULE BY MOUTH EVERY DAY 90 capsule 3   sertraline (ZOLOFT) 100 MG tablet Take 100 mg by mouth daily.     simvastatin (ZOCOR) 40 MG tablet Take 1 tablet (40 mg total) by mouth daily. 90 tablet 0   fluticasone (FLONASE) 50 MCG/ACT nasal spray SPRAY TWO SPRAYS INTO BOTH NOSTRILS DAILY AS NEEDED 16 mL 2   No current facility-administered medications for this visit.    Allergies as of 11/23/2022   (No Known Allergies)    Family History  Problem Relation Age of Onset   Prostate cancer Maternal Grandfather    Stomach cancer Neg Hx    Esophageal cancer Neg Hx    Rectal cancer Neg Hx    Colon polyps Neg Hx    Colon cancer Neg Hx     Social History   Socioeconomic History   Marital status: Married    Spouse name: Not on file   Number of children: Not on file   Years of education: Not on file   Highest education level: Not on file  Occupational History   Not on file  Tobacco  Use   Smoking status: Former    Types: Cigarettes    Quit date: 08/24/1992    Years since quitting: 30.2   Smokeless tobacco: Never  Vaping Use   Vaping Use: Never used  Substance and Sexual Activity   Alcohol use: Yes    Comment: occ   Drug use: No   Sexual activity: Yes    Partners: Female    Birth control/protection: None  Other Topics Concern   Not on file  Social History Narrative   Not on file   Social Determinants of Health   Financial Resource Strain: Not on file  Food Insecurity: Not on file  Transportation Needs: Not on file  Physical Activity: Not on file  Stress: Not on file  Social Connections: Not on file  Intimate Partner Violence: Not on file    Physical  Exam: Vital signs in last 24 hours: @BP  134/74   Pulse 74   Temp 98.4 F (36.9 C) (Temporal)   Ht 5' 10.25" (1.784 m)   Wt 232 lb (105.2 kg)   SpO2 95%   BMI 33.05 kg/m  GEN: NAD EYE: Sclerae anicteric ENT: MMM CV: Non-tachycardic Pulm: CTA b/l GI: Soft, NT/ND NEURO:  Alert & Oriented x 3   04-11-2001, MD [facility]  11/23/2022 2:13 PM

## 2022-11-23 NOTE — Op Note (Signed)
[facility] Patient Name: Erik [name] Procedure Date: 11/23/2022 2:12 PM MRN: [id number] Endoscopist: Beverley Fiedler , MD, [id number] Age: 61 Referring MD:  Date of Birth: July 17, 1961 Gender: Male Account #: [id number] Procedure:                Colonoscopy Indications:              Screening for colorectal malignant neoplasm, Last                            colonoscopy 10 years ago Medicines:                Monitored Anesthesia Care Procedure:                Pre-Anesthesia Assessment:                           - Prior to the procedure, a History and Physical                            was performed, and patient medications and                            allergies were reviewed. The patient's tolerance of                            previous anesthesia was also reviewed. The risks                            and benefits of the procedure and the sedation                            options and risks were discussed with the patient.                            All questions were answered, and informed consent                            was obtained. Prior Anticoagulants: The patient has                            taken no anticoagulant or antiplatelet agents. ASA                            Grade Assessment: II - A patient with mild systemic                            disease. After reviewing the risks and benefits,                            the patient was deemed in satisfactory condition to                            undergo the procedure.  After obtaining informed consent, the colonoscope                            was passed under direct vision. Throughout the                            procedure, the patient's blood pressure, pulse, and                            oxygen saturations were monitored continuously. The                            Colonoscope was introduced through the anus and                            advanced to the cecum, identified by  appendiceal                            orifice and ileocecal valve. The colonoscopy was                            performed without difficulty. The patient tolerated                            the procedure well. The quality of the bowel                            preparation was good. The terminal ileum, ileocecal                            valve, appendiceal orifice, and rectum were                            photographed. Scope In: 2:24:24 PM Scope Out: 2:35:13 PM Scope Withdrawal Time: 0 hours 8 minutes 12 seconds  Total Procedure Duration: 0 hours 10 minutes 49 seconds  Findings:                 The digital rectal exam was normal.                           The entire examined colon appeared normal on direct                            and retroflexion views. Complications:            No immediate complications. Estimated Blood Loss:     Estimated blood loss: none. Impression:               - The entire examined colon is normal on direct and                            retroflexion views.                           - No specimens collected. Recommendation:           -  Patient has a contact number available for                            emergencies. The signs and symptoms of potential                            delayed complications were discussed with the                            patient. Return to normal activities tomorrow.                            Written discharge instructions were provided to the                            patient.                           - Resume previous diet.                           - Continue present medications.                           - Repeat colonoscopy in 10 years for screening                            purposes. Beverley Fiedler, MD 11/23/2022 2:42:28 PM This report has been signed electronically.

## 2022-11-23 NOTE — Progress Notes (Signed)
Pt in and out of trigeminy in recovery. Pt is having no symptoms.   Per Dr Rhea Belton patient advised to see a cardiologist out of precaution. Strips printed and sent with patient.

## 2022-11-24 ENCOUNTER — Telehealth: Payer: Self-pay | Admitting: *Deleted

## 2022-11-24 ENCOUNTER — Other Ambulatory Visit: Payer: Self-pay

## 2022-11-24 ENCOUNTER — Telehealth: Payer: Self-pay | Admitting: Internal Medicine

## 2022-11-24 DIAGNOSIS — R008 Other abnormalities of heart beat: Secondary | ICD-10-CM

## 2022-11-24 NOTE — Telephone Encounter (Signed)
Inbound call from patient wife stating she called [facility] to schedule appt per Dr.Pyrtles recommendation after his procedure yesterday but they need a referral. Please advise.

## 2022-11-24 NOTE — Telephone Encounter (Signed)
Referral entered in epic for pt to see cardiology due to trigeminy during recent procedure. Wife aware.

## 2022-11-24 NOTE — Progress Notes (Signed)
Cardiology Office Note:    Date:  11/25/2022   ID:  Erik [name], DOB July 23, 1961, MRN [id number]  PCP:  [name], MD   [facility] Providers Cardiologist:  Alverda Skeans, MD Referring MD: [name], MD   Chief Complaint/Reason for Referral: Trigeminy on monitor following colonoscopy  ASSESSMENT:    1. Trigeminy   2. Hyperlipidemia LDL goal <70   3. Primary hypertension   4. History of TIA (transient ischemic attack)     PLAN:    In order of problems listed above: 1.  Trigeminy: We will obtain echocardiogram and monitor to evaluate further.  Reviewed his strips and these show sinus rhythm with trigeminy.  I think this is likely related to the anesthesia that he received during his colonoscopy. 2.  Hyperlipidemia: This is being followed to the patient's primary care provider. 3.  Hypertension: Pressures well-controlled on his current regimen. 4.  TIA: I reviewed neurology notes.  They believe that his issue was actually vestibular.  He had an MRI which was negative.            Dispo:  Return if symptoms worsen or fail to improve.      Medication Adjustments/Labs and Tests Ordered: Current medicines are reviewed at length with the patient today.  Concerns regarding medicines are outlined above.  The following changes have been made:  no change   Labs/tests ordered: Orders Placed This Encounter  Procedures   EKG 12-Lead   ECHOCARDIOGRAM COMPLETE    Medication Changes: No orders of the defined types were placed in this encounter.    Current medicines are reviewed at length with the patient today.  The patient does not have concerns regarding medicines.   History of Present Illness:    FOCUSED PROBLEM LIST:   1.  Hypertension 2.  Hyperlipidemia 3.  Right bundle branch block 4.  BMI 33 5.  OSA on CPAP  The patient is a 61 y.o. male with the indicated medical history here for recommendations regarding incidentally noted trigeminy after  colonoscopy.  The patient underwent a screening colonoscopy a few days ago.  When he was in recovery was noted that he had frequent ectopy and possibly trigeminy.  For this reason he was referred for cardiology consultation.  I reviewed the strips and they show trigeminy.  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  He denies any presyncope, syncope, palpitations, paroxysmal nocturnal dyspnea, exertional chest pain, or peripheral edema.  He has been using his CPAP religiously.  I did review his records and he had an episode several years ago of dizziness.  He was seen by neurology who did not think that he had a stroke or TIA.         Current Medications: Current Meds  Medication Sig   fluticasone (FLONASE) 50 MCG/ACT nasal spray SPRAY TWO SPRAYS INTO BOTH NOSTRILS DAILY AS NEEDED   loratadine (CLARITIN) 10 MG tablet Take 10 mg by mouth daily as needed.    losartan-hydrochlorothiazide (HYZAAR) 50-12.5 MG tablet TAKE 1/2 TABLET BY MOUTH EVERY DAY   omeprazole (PRILOSEC) 20 MG capsule TAKE 1 CAPSULE BY MOUTH EVERY DAY   sertraline (ZOLOFT) 100 MG tablet Take 100 mg by mouth daily.   simvastatin (ZOCOR) 40 MG tablet Take 1 tablet (40 mg total) by mouth daily.     Allergies:    Patient has no known allergies.   Social History:   Social History   Tobacco Use   Smoking status:  Former    Types: Cigarettes    Quit date: 08/24/1992    Years since quitting: 30.2   Smokeless tobacco: Never  Vaping Use   Vaping Use: Never used  Substance Use Topics   Alcohol use: Yes    Comment: occ   Drug use: No     Family Hx: Family History  Problem Relation Age of Onset   Prostate cancer Maternal Grandfather    Stomach cancer Neg Hx    Esophageal cancer Neg Hx    Rectal cancer Neg Hx    Colon polyps Neg Hx    Colon cancer Neg Hx      Review of Systems:   Please see the history of present illness.    All other systems reviewed and are negative.     EKGs/Labs/Other  Test Reviewed:    EKG:  EKG performed 2015 that I personally reviewed demonstrates sinus rhythm; EKG performed today that I personally reviewed demonstrates sinus rhythm with right bundle branch block.  Prior CV studies: None available  Other studies Reviewed: Review of the additional studies/records demonstrates: No imaging evidence demonstrating coronary artery calcification or aortic atherosclerosis available.  Recent Labs: 08/18/2022: ALT 22; BUN 19; Creatinine, Ser 0.90; Hemoglobin 14.3; Platelets 227.0; Potassium 4.1; Sodium 141   Recent Lipid Panel Lab Results  Component Value Date/Time   CHOL 186 08/18/2022 07:38 AM   TRIG 186.0 (H) 08/18/2022 07:38 AM   HDL 46.70 08/18/2022 07:38 AM   LDLCALC 102 (H) 08/18/2022 07:38 AM   LDLDIRECT 140.9 08/24/2012 04:59 PM    Risk Assessment/Calculations:                Physical Exam:    VS:  BP 126/82   Pulse 65   Ht 5\' 10"  (1.778 m)   Wt 232 lb 6.4 oz (105.4 kg)   SpO2 97%   BMI 33.35 kg/m    Wt Readings from Last 3 Encounters:  11/25/22 232 lb 6.4 oz (105.4 kg)  11/23/22 232 lb (105.2 kg)  11/18/22 232 lb (105.2 kg)    GENERAL:  No apparent distress, AOx3 HEENT:  No carotid bruits, +2 carotid impulses, no scleral icterus CAR: RRR no murmurs, gallops, rubs, or thrills RES:  Clear to auscultation bilaterally ABD:  Soft, nontender, nondistended, positive bowel sounds x 4 VASC:  +2 radial pulses, +2 carotid pulses, palpable pedal pulses NEURO:  CN 2-12 grossly intact; motor and sensory grossly intact PSYCH:  No active depression or anxiety EXT:  No edema, ecchymosis, or cyanosis  Signed, 11/20/22, MD  11/25/2022 12:42 PM    [facility] [address] [place], [place], [place]  [place] Phone: [phone]; Fax: [phone]   Note:  This document was prepared using Dragon voice recognition software and may include unintentional dictation errors.

## 2022-11-24 NOTE — Telephone Encounter (Signed)
  Follow up Call-     11/23/2022    1:06 PM  Call back number  Post procedure Call Back phone  # [phone]  Permission to leave phone message Yes     Patient questions:  Do you have a fever, pain , or abdominal swelling? No. Pain Score  0 *  Have you tolerated food without any problems? Yes.    Have you been able to return to your normal activities? Yes.    Do you have any questions about your discharge instructions: Diet   No. Medications  No. Follow up visit  No.  Do you have questions or concerns about your Care? No.  Actions: * If pain score is 4 or above: No action needed, pain <4.

## 2022-11-25 ENCOUNTER — Other Ambulatory Visit: Payer: Self-pay | Admitting: Internal Medicine

## 2022-11-25 ENCOUNTER — Encounter: Payer: Self-pay | Admitting: Internal Medicine

## 2022-11-25 ENCOUNTER — Ambulatory Visit: Payer: 59 | Attending: Internal Medicine | Admitting: Internal Medicine

## 2022-11-25 ENCOUNTER — Ambulatory Visit (INDEPENDENT_AMBULATORY_CARE_PROVIDER_SITE_OTHER): Payer: 59

## 2022-11-25 VITALS — BP 126/82 | HR 65 | Ht 70.0 in | Wt 232.4 lb

## 2022-11-25 DIAGNOSIS — R008 Other abnormalities of heart beat: Secondary | ICD-10-CM | POA: Diagnosis not present

## 2022-11-25 DIAGNOSIS — I10 Essential (primary) hypertension: Secondary | ICD-10-CM

## 2022-11-25 DIAGNOSIS — I493 Ventricular premature depolarization: Secondary | ICD-10-CM | POA: Diagnosis not present

## 2022-11-25 DIAGNOSIS — Z8673 Personal history of transient ischemic attack (TIA), and cerebral infarction without residual deficits: Secondary | ICD-10-CM

## 2022-11-25 DIAGNOSIS — E785 Hyperlipidemia, unspecified: Secondary | ICD-10-CM

## 2022-11-25 NOTE — Patient Instructions (Addendum)
Medication Instructions:  No changes *If you need a refill on your cardiac medications before your next appointment, please call your pharmacy*   Lab Work: none   Testing/Procedures: Your physician has requested that you have an echocardiogram. Echocardiography is a painless test that uses sound waves to create images of your heart. It provides your doctor with information about the size and shape of your heart and how well your heart's chambers and valves are working. This procedure takes approximately one hour. There are no restrictions for this procedure. Please do NOT wear cologne, perfume, aftershave, or lotions (deodorant is allowed). Please arrive 15 minutes prior to your appointment time.  Zio heart monitor - 3 days --see instructions below  Follow up:  As needed  ZIO XT- Long Term Monitor Instructions  Your physician has requested you wear a ZIO patch monitor for 3 days.  This is a single patch monitor. [employer] supplies one patch monitor per enrollment. Additional stickers are not available. Please do not apply patch if you will be having a Nuclear Stress Test,  Echocardiogram, Cardiac CT, MRI, or Chest Xray during the period you would be wearing the  monitor. The patch cannot be worn during these tests. You cannot remove and re-apply the  ZIO XT patch monitor.  Your ZIO patch monitor will be mailed 3 day USPS to your address on file. It may take 3-5 days  to receive your monitor after you have been enrolled.  Once you have received your monitor, please review the enclosed instructions. Your monitor  has already been registered assigning a specific monitor serial # to you.  Billing and Patient Assistance Program Information  We have supplied [employer] with any of your insurance information on file for billing purposes. [employer] offers a sliding scale Patient Assistance Program for patients that do not have  insurance, or whose insurance does not completely cover the cost of  the ZIO monitor.  You must apply for the Patient Assistance Program to qualify for this discounted rate.  To apply, please call [employer] at [phone], select option 4, select option 2, ask to apply for  Patient Assistance Program. Meredeth Ide will ask your household income, and how many people  are in your household. They will quote your out-of-pocket cost based on that information.  [employer] will also be able to set up a 60-month, interest-free payment plan if needed.  Applying the monitor   Shave hair from upper left chest.  Hold abrader disc by orange tab. Rub abrader in 40 strokes over the upper left chest as  indicated in your monitor instructions.  Clean area with 4 enclosed alcohol pads. Let dry.  Apply patch as indicated in monitor instructions. Patch will be placed under collarbone on left  side of chest with arrow pointing upward.  Rub patch adhesive wings for 2 minutes. Remove white label marked "1". Remove the white  label marked "2". Rub patch adhesive wings for 2 additional minutes.  While looking in a mirror, press and release button in center of patch. A small green light will  flash 3-4 times. This will be your only indicator that the monitor has been turned on.  Do not shower for the first 24 hours. You may shower after the first 24 hours.  Press the button if you feel a symptom. You will hear a small click. Record Date, Time and  Symptom in the Patient Logbook.  When you are ready to remove the patch, follow instructions on the last 2 pages  of Patient  Logbook. Stick patch monitor onto the last page of Patient Logbook.  Place Patient Logbook in the blue and white box. Use locking tab on box and tape box closed  securely. The blue and white box has prepaid postage on it. Please place it in the mailbox as  soon as possible. Your physician should have your test results approximately 7 days after the  monitor has been mailed back to [facility].  Call [facility] Customer  Care at [phone] if you have questions regarding  your ZIO XT patch monitor. Call them immediately if you see an orange light blinking on your  monitor.  If your monitor falls off in less than 4 days, contact our Monitor department at [phone].  If your monitor becomes loose or falls off after 4 days call [employer] at [phone] for  suggestions on securing your monitor

## 2022-11-25 NOTE — Progress Notes (Unsigned)
Enrolled for [employer] to mail a ZIO XT long term holter monitor to the patients address on file.   Applied [id number] in office

## 2022-12-02 ENCOUNTER — Ambulatory Visit (HOSPITAL_COMMUNITY)
Admission: RE | Admit: 2022-12-02 | Discharge: 2022-12-02 | Disposition: A | Discharge status: Home / Self Care | Payer: 59 | Source: Ambulatory Visit | Attending: Internal Medicine | Admitting: Internal Medicine

## 2022-12-02 DIAGNOSIS — R008 Other abnormalities of heart beat: Secondary | ICD-10-CM | POA: Diagnosis present

## 2022-12-02 LAB — ECHOCARDIOGRAM COMPLETE
AR max vel: 3.08 cm2
AV Area VTI: 2.88 cm2
AV Area mean vel: 3.18 cm2
AV Mean grad: 6 mmHg
AV Peak grad: 11.8 mmHg
Ao pk vel: 1.72 m/s
Area-P 1/2: 3.31 cm2
S' Lateral: 4 cm

## 2022-12-02 NOTE — Progress Notes (Signed)
*  PRELIMINARY RESULTS* Echocardiogram 2D Echocardiogram has been performed.  Erik [name] 12/02/2022, 9:22 AM

## 2022-12-15 ENCOUNTER — Telehealth: Payer: Self-pay | Admitting: Internal Medicine

## 2022-12-15 NOTE — Telephone Encounter (Signed)
Called and reviewed monitor results with pt. Scheduled him for three month follow up as requested on 03/16/23 @8am .

## 2022-12-15 NOTE — Telephone Encounter (Signed)
-----   Message from Orbie Pyo, MD sent at 12/08/2022  6:53 AM EST ----- Let him know his monitor showed occasional PVCs and trigeminy.  I would like to see him back in 3 months.

## 2023-02-24 ENCOUNTER — Other Ambulatory Visit: Payer: Self-pay | Admitting: Family Medicine

## 2023-02-24 DIAGNOSIS — F411 Generalized anxiety disorder: Secondary | ICD-10-CM

## 2023-03-11 NOTE — Progress Notes (Unsigned)
Cardiology Office Note:    Date:  03/17/2023   ID:  Erik [name], DOB 1961/09/29, MRN [id number]  PCP:  Owens Loffler, MD   [facility] Providers Cardiologist:  Lenna Sciara, MD Referring MD: Owens Loffler, MD   Chief Complaint/Reason for Referral: Trigeminy on monitor following colonoscopy  PATIENT WAS RESCHEDULED   ASSESSMENT:    1. Trigeminy   2. Cardiomyopathy, unspecified type ([place])   3. Hyperlipidemia LDL goal <70   4. Primary hypertension   5. BMI 33.0-33.9,adult   6. RBBB      PLAN:    In order of problems listed above: 1.  Trigeminy: Start BB as described below.  CMR to evaluate further especially given RBBB. 2.  Cardiomyopathy:  Mild in nature, may be due to frequent ectopy; start Toprol XL 25mg  QPM; will obtain CMR to evaluate further.  May need ischemic evaluation depending on collective results. 3.  Hyperlipidemia: This is being followed to the patient's primary care provider. 4.  Hypertension:  5.  Elevated BMI: 6.  RBBB:  Given frequent ectopy and RBBB, will obtain CMR to evaluate further.               History of Present Illness:    FOCUSED PROBLEM LIST:   1.  Hypertension 2.  Hyperlipidemia 3.  Right bundle branch block 4.  BMI 33 5.  OSA on CPAP  December 2023: The patient is a 62 y.o. male with the indicated medical history here for recommendations regarding incidentally noted trigeminy after colonoscopy.  The patient underwent a screening colonoscopy a few days ago.  When he was in recovery was noted that he had frequent ectopy and possibly trigeminy.  For this reason he was referred for cardiology consultation.  I reviewed the strips and they show trigeminy.  The patient is a Administrator.  He is completely asymptomatic.  He denies any presyncope, syncope, palpitations, paroxysmal nocturnal dyspnea, exertional chest pain, or peripheral edema.  He has been using his CPAP religiously.  I did review his records and he had an  episode several years ago of dizziness.  He was seen by neurology who did not think that he had a stroke or TIA.  Plan:TTE and monitor.  Today: Patient returns for routine follow-up.  In the interim he had an echocardiogram which showed a mildly reduced LV function and monitor which demonstrated frequent PACs and PVCs.         Current Medications: No outpatient medications have been marked as taking for the 03/16/23 encounter (Office Visit) with Early Osmond, MD.     Allergies:    Patient has no known allergies.   Social History:   Social History   Tobacco Use   Smoking status: Former    Types: Cigarettes    Quit date: 08/24/1992    Years since quitting: 30.5   Smokeless tobacco: Never  Vaping Use   Vaping Use: Never used  Substance Use Topics   Alcohol use: Yes    Comment: occ   Drug use: No     Family Hx: Family History  Problem Relation Age of Onset   Prostate cancer Maternal Grandfather    Stomach cancer Neg Hx    Esophageal cancer Neg Hx    Rectal cancer Neg Hx    Colon polyps Neg Hx    Colon cancer Neg Hx      Review of Systems:   Please see the history of present illness.    All other  systems reviewed and are negative.     EKGs/Labs/Other Test Reviewed:    EKG:  EKG performed 2015 that I personally reviewed demonstrates sinus rhythm; EKG performed today that I personally reviewed demonstrates sinus rhythm with right bundle branch block.  Prior CV studies:  Monitor 2023: Isolated SVEs were rare (<1.0%), SVE Couplets were rare (<1.0%), and no SVE Triplets were  present.    Isolated VEs were frequent (7.2%, 21121), VE Couplets were rare (<1.0%, 209), and VE Triplets were rare (<1.0%, 23).    Ventricular Bigeminy and Trigeminy were present.   No atrial fibrillation, sustained ventricular tachyarrhythmias, or bradyarrhythmias were detected.   No patient triggered events.  TTE 2023: 1. Left ventricular ejection fraction, by estimation, is 50 to  55%. The  left ventricle has low normal function. The left ventricle has no regional  wall motion abnormalities. The left ventricular internal cavity size was  mildly dilated. Left ventricular  diastolic parameters were normal.   2. Right ventricular systolic function is normal. The right ventricular  size is normal. Tricuspid regurgitation signal is inadequate for assessing  PA pressure.   3. The mitral valve is grossly normal, mild annular calcification.  Trivial mitral valve regurgitation.   4. The aortic valve is tricuspid. Aortic valve regurgitation is not  visualized. Aortic valve mean gradient measures 6.0 mmHg.   5. The inferior vena cava is normal in size with greater than 50%  respiratory variability, suggesting right atrial pressure of 3 mmHg.   Other studies Reviewed: Review of the additional studies/records demonstrates: No imaging evidence demonstrating coronary artery calcification or aortic atherosclerosis available.  Recent Labs: 08/18/2022: ALT 22; BUN 19; Creatinine, Ser 0.90; Hemoglobin 14.3; Platelets 227.0; Potassium 4.1; Sodium 141   Recent Lipid Panel Lab Results  Component Value Date/Time   CHOL 186 08/18/2022 07:38 AM   TRIG 186.0 (H) 08/18/2022 07:38 AM   HDL 46.70 08/18/2022 07:38 AM   LDLCALC 102 (H) 08/18/2022 07:38 AM   LDLDIRECT 140.9 08/24/2012 04:59 PM    Risk Assessment/Calculations:             Physical Exam:     Signed, Early Osmond, MD  03/17/2023 7:00 AM    [address], [place], [place]  [address] Phone: [phone]; Fax: [phone]   Note:  This document was prepared using Dragon voice recognition software and may include unintentional dictation errors.

## 2023-03-16 ENCOUNTER — Ambulatory Visit (INDEPENDENT_AMBULATORY_CARE_PROVIDER_SITE_OTHER): Payer: 59 | Admitting: Internal Medicine

## 2023-03-16 DIAGNOSIS — R008 Other abnormalities of heart beat: Secondary | ICD-10-CM

## 2023-03-16 DIAGNOSIS — I429 Cardiomyopathy, unspecified: Secondary | ICD-10-CM

## 2023-03-16 DIAGNOSIS — I10 Essential (primary) hypertension: Secondary | ICD-10-CM

## 2023-03-16 DIAGNOSIS — E785 Hyperlipidemia, unspecified: Secondary | ICD-10-CM

## 2023-03-16 DIAGNOSIS — Z6833 Body mass index (BMI) 33.0-33.9, adult: Secondary | ICD-10-CM

## 2023-03-16 DIAGNOSIS — I4510 Unspecified right bundle-branch block: Secondary | ICD-10-CM

## 2023-03-31 ENCOUNTER — Other Ambulatory Visit: Payer: Self-pay | Admitting: Family Medicine

## 2023-03-31 DIAGNOSIS — J309 Allergic rhinitis, unspecified: Secondary | ICD-10-CM

## 2023-03-31 DIAGNOSIS — E785 Hyperlipidemia, unspecified: Secondary | ICD-10-CM

## 2023-04-13 ENCOUNTER — Encounter: Payer: Self-pay | Admitting: Internal Medicine

## 2023-04-13 ENCOUNTER — Ambulatory Visit: Payer: 59 | Attending: Internal Medicine | Admitting: Internal Medicine

## 2023-04-13 VITALS — BP 140/80 | HR 62 | Ht 70.0 in | Wt 232.0 lb

## 2023-04-13 DIAGNOSIS — Z6833 Body mass index (BMI) 33.0-33.9, adult: Secondary | ICD-10-CM

## 2023-04-13 DIAGNOSIS — E785 Hyperlipidemia, unspecified: Secondary | ICD-10-CM | POA: Diagnosis not present

## 2023-04-13 DIAGNOSIS — R008 Other abnormalities of heart beat: Secondary | ICD-10-CM

## 2023-04-13 DIAGNOSIS — I10 Essential (primary) hypertension: Secondary | ICD-10-CM

## 2023-04-13 DIAGNOSIS — I429 Cardiomyopathy, unspecified: Secondary | ICD-10-CM | POA: Diagnosis not present

## 2023-04-13 DIAGNOSIS — I4510 Unspecified right bundle-branch block: Secondary | ICD-10-CM

## 2023-04-13 MED ORDER — LOSARTAN POTASSIUM-HCTZ 50-12.5 MG PO TABS: 1.0000 | ORAL_TABLET | Freq: Every day | ORAL | 3 refills | Status: DC

## 2023-04-13 NOTE — Patient Instructions (Signed)
Medication Instructions:  Your physician has recommended you make the following change in your medication:  INCREASE: losartan-hydrochlorothiazide 50-12.5 mg to a full pill by mouth once daily   *If you need a refill on your cardiac medications before your next appointment, please call your pharmacy*   Lab Work: NONE If you have labs (blood work) drawn today and your tests are completely normal, you will receive your results only by: MyChart Message (if you have MyChart) OR A paper copy in the mail If you have any lab test that is abnormal or we need to change your treatment, we will call you to review the results.   Testing/Procedures: Your physician has requested that you have an echocardiogram. Echocardiography is a painless test that uses sound waves to create images of your heart. It provides your doctor with information about the size and shape of your heart and how well your heart's chambers and valves are working. This procedure takes approximately one hour. There are no restrictions for this procedure. Please do NOT wear cologne, perfume, aftershave, or lotions (deodorant is allowed). Please arrive 15 minutes prior to your appointment time.    Follow-Up: At [facility], you and your health needs are our priority.  As part of our continuing mission to provide you with exceptional heart care, we have created designated Provider Care Teams.  These Care Teams include your primary Cardiologist (physician) and Advanced Practice Providers (APPs -  Physician Assistants and Nurse Practitioners) who all work together to provide you with the care you need, when you need it.    Your next appointment:   1 year(s)  Provider:   Alverda Skeans, MD

## 2023-04-13 NOTE — Progress Notes (Signed)
Cardiology Office Note:    Date:  04/13/2023   ID:  Erik [name], DOB September 24, 1961, MRN [id number]  PCP:  [name], MD   [facility] Providers Cardiologist:  Alverda Skeans, MD Referring MD: [name], MD   Chief Complaint/Reason for Referral: Trigeminy on monitor following colonoscopy  ASSESSMENT:    1. Trigeminy   2. Cardiomyopathy, unspecified type   3. Dyslipidemia   4. Primary hypertension   5. BMI 33.0-33.9,adult   6. RBBB      PLAN:    In order of problems listed above: 1.  Trigeminy: This is resolved.  His EKG today demonstrates sinus bradycardia.   2.  Cardiomyopathy:  Mild in nature, may be due to frequent ectopy which has resolved on EKG done today and per patient symptoms.  Will obtain an echocardiogram in 2 months.  If LV dysfunction still persists we will pursue an ischemic evaluation and possibly a cardiac MRI if no coronary artery disease is seen particularly given his right bundle branch block. 3.  Hyperlipidemia: This is being followed to the patient's primary care provider.   4.  Hypertension: Will increase Hyzaar from half a pill to a full pill. 5.  Elevated BMI: Continue diet and exercise modification. 6.  RBBB: See discussion above.           Dispo:  Return in about 1 year (around 04/12/2024).      Medication Adjustments/Labs and Tests Ordered: Current medicines are reviewed at length with the patient today.  Concerns regarding medicines are outlined above.  The following changes have been made:  no change   Labs/tests ordered: Orders Placed This Encounter  Procedures   ECHOCARDIOGRAM COMPLETE    Medication Changes: Meds ordered this encounter  Medications   losartan-hydrochlorothiazide (HYZAAR) 50-12.5 MG tablet    Sig: Take 1 tablet by mouth daily.    Dispense:  90 tablet    Refill:  3     Current medicines are reviewed at length with the patient today.  The patient does not have concerns regarding  medicines.   History of Present Illness:    FOCUSED PROBLEM LIST:   1.  Hypertension 2.  Hyperlipidemia 3.  Right bundle branch block 4.  BMI 33 5.  OSA on CPAP  December 2023: The patient is a 62 y.o. male with the indicated medical history here for recommendations regarding incidentally noted trigeminy after colonoscopy.  The patient underwent a screening colonoscopy a few days ago.  When he was in recovery was noted that he had frequent ectopy and possibly trigeminy.  For this reason he was referred for cardiology consultation.  I reviewed the strips and they show trigeminy.   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  He denies any presyncope, syncope, palpitations, paroxysmal nocturnal dyspnea, exertional chest pain, or peripheral edema.  He has been using his CPAP religiously.   I did review his records and he had an episode several years ago of dizziness.  He was seen by neurology who did not think that he had a stroke or TIA.  Plan:TTE and monitor.   Today: Patient returns for routine follow-up.  In the interim he had an echocardiogram which showed a mildly reduced LV function and monitor which demonstrated frequent PACs and PVCs.  Today the patient tells me that he is quite well.  He denies any shortness of breath, chest pain, presyncope, syncope, or palpitations.  He is well able to do everything that he  needs to do in a day.  He has not required any emergency room visits or hospitalizations.         Current Medications: Current Meds  Medication Sig   fluticasone (FLONASE) 50 MCG/ACT nasal spray TAKE TWO SPRAYS INTO BOTH NOSTRILS DAILY AS NEEDED   loratadine (CLARITIN) 10 MG tablet Take 10 mg by mouth daily as needed.    losartan-hydrochlorothiazide (HYZAAR) 50-12.5 MG tablet Take 1 tablet by mouth daily.   omeprazole (PRILOSEC) 20 MG capsule TAKE 1 CAPSULE BY MOUTH EVERY DAY   sertraline (ZOLOFT) 100 MG tablet Take 1 tablet (100 mg total) by mouth daily.    simvastatin (ZOCOR) 40 MG tablet TAKE 1 TABLET BY MOUTH EVERY DAY   [DISCONTINUED] losartan-hydrochlorothiazide (HYZAAR) 50-12.5 MG tablet TAKE 1/2 TABLET BY MOUTH EVERY DAY     Allergies:    Patient has no known allergies.   Social History:   Social History   Tobacco Use   Smoking status: Former    Types: Cigarettes    Quit date: 08/24/1992    Years since quitting: 30.6   Smokeless tobacco: Never  Vaping Use   Vaping Use: Never used  Substance Use Topics   Alcohol use: Yes    Comment: occ   Drug use: No     Family Hx: Family History  Problem Relation Age of Onset   Prostate cancer Maternal Grandfather    Stomach cancer Neg Hx    Esophageal cancer Neg Hx    Rectal cancer Neg Hx    Colon polyps Neg Hx    Colon cancer Neg Hx      Review of Systems:   Please see the history of present illness.    All other systems reviewed and are negative.     EKGs/Labs/Other Test Reviewed:    EKG:  EKG performed 2015 that I personally reviewed demonstrates sinus rhythm; EKG performed today that I personally reviewed demonstrates sinus rhythm with right bundle branch block.  EKG today demonstrates sinus bradycardia with a right bundle branch block  Monitor 2023: Isolated SVEs were rare (<1.0%), SVE Couplets were rare (<1.0%), and no SVE Triplets were  present.    Isolated VEs were frequent (7.2%, 21121), VE Couplets were rare (<1.0%, 209), and VE Triplets were rare (<1.0%, 23).    Ventricular Bigeminy and Trigeminy were present.   No atrial fibrillation, sustained ventricular tachyarrhythmias, or bradyarrhythmias were detected.   No patient triggered events.   TTE 2023: 1. Left ventricular ejection fraction, by estimation, is 50 to 55%. The  left ventricle has low normal function. The left ventricle has no regional  wall motion abnormalities. The left ventricular internal cavity size was  mildly dilated. Left ventricular  diastolic parameters were normal.   2. Right  ventricular systolic function is normal. The right ventricular  size is normal. Tricuspid regurgitation signal is inadequate for assessing  PA pressure.   3. The mitral valve is grossly normal, mild annular calcification.  Trivial mitral valve regurgitation.   4. The aortic valve is tricuspid. Aortic valve regurgitation is not  visualized. Aortic valve mean gradient measures 6.0 mmHg.   5. The inferior vena cava is normal in size with greater than 50%  respiratory variability, suggesting right atrial pressure of 3 mmHg.    Other studies Reviewed: Review of the additional studies/records demonstrates: No imaging evidence demonstrating coronary artery calcification or aortic atherosclerosis available.  Recent Labs: 08/18/2022: ALT 22; BUN 19; Creatinine, Ser 0.90; Hemoglobin 14.3; Platelets 227.0; Potassium 4.1;  Sodium 141   Recent Lipid Panel Lab Results  Component Value Date/Time   CHOL 186 08/18/2022 07:38 AM   TRIG 186.0 (H) 08/18/2022 07:38 AM   HDL 46.70 08/18/2022 07:38 AM   LDLCALC 102 (H) 08/18/2022 07:38 AM   LDLDIRECT 140.9 08/24/2012 04:59 PM    Risk Assessment/Calculations:           HYPERTENSION CONTROL Vitals:   04/13/23 1425 04/13/23 1527  BP: (!) 141/84 (!) 140/80    The patient's blood pressure is elevated above target today.  In order to address the patient's elevated BP: A current anti-hypertensive medication was adjusted today.       Physical Exam:    VS:  BP (!) 140/80   Pulse 62   Ht  (1.778 m)   Wt 232 lb (105.2 kg)   SpO2 93%   BMI 33.29 kg/m    Wt Readings from Last 3 Encounters:  04/13/23 232 lb (105.2 kg)  11/25/22 232 lb 6.4 oz (105.4 kg)  11/23/22 232 lb (105.2 kg)    GENERAL:  No apparent distress, AOx3 HEENT:  No carotid bruits, +2 carotid impulses, no scleral icterus CAR: RRR no murmurs, gallops, rubs, or thrills RES:  Clear to auscultation bilaterally ABD:  Soft, nontender, nondistended, positive bowel sounds x 4 VASC:   +2 radial pulses, +2 carotid pulses, palpable pedal pulses NEURO:  CN 2-12 grossly intact; motor and sensory grossly intact PSYCH:  No active depression or anxiety EXT:  No edema, ecchymosis, or cyanosis  Signed, Orbie Pyo, MD  04/13/2023 3:43 PM    [facility] [address] [place], [place], [place]  [address] Phone: [phone]; Fax: [phone]   Note:  This document was prepared using Dragon voice recognition software and may include unintentional dictation errors.

## 2023-04-20 ENCOUNTER — Ambulatory Visit: Payer: 59 | Admitting: Physician Assistant

## 2023-04-22 NOTE — Addendum Note (Signed)
Addended by: Ferne Reus on: 04/22/2023 04:54 PM   Modules accepted: Orders

## 2023-05-23 ENCOUNTER — Ambulatory Visit (HOSPITAL_COMMUNITY): Payer: 59 | Attending: Internal Medicine

## 2023-05-23 DIAGNOSIS — I429 Cardiomyopathy, unspecified: Secondary | ICD-10-CM

## 2023-05-23 DIAGNOSIS — R008 Other abnormalities of heart beat: Secondary | ICD-10-CM | POA: Diagnosis present

## 2023-05-23 DIAGNOSIS — I428 Other cardiomyopathies: Secondary | ICD-10-CM

## 2023-05-23 LAB — ECHOCARDIOGRAM COMPLETE
Calc EF: 50 %
S' Lateral: 3.3 cm
Single Plane A2C EF: 54.3 %
Single Plane A4C EF: 50.8 %

## 2023-07-12 ENCOUNTER — Other Ambulatory Visit: Payer: Self-pay | Admitting: Family Medicine

## 2023-07-12 DIAGNOSIS — E785 Hyperlipidemia, unspecified: Secondary | ICD-10-CM

## 2023-07-13 ENCOUNTER — Other Ambulatory Visit: Payer: Self-pay | Admitting: Family Medicine

## 2023-07-13 DIAGNOSIS — J309 Allergic rhinitis, unspecified: Secondary | ICD-10-CM

## 2023-07-26 ENCOUNTER — Telehealth: Payer: Self-pay | Admitting: Family Medicine

## 2023-07-26 DIAGNOSIS — F411 Generalized anxiety disorder: Secondary | ICD-10-CM

## 2023-07-26 DIAGNOSIS — K219 Gastro-esophageal reflux disease without esophagitis: Secondary | ICD-10-CM

## 2023-07-26 DIAGNOSIS — J309 Allergic rhinitis, unspecified: Secondary | ICD-10-CM

## 2023-07-26 MED ORDER — FLUTICASONE PROPIONATE 50 MCG/ACT NA SUSP: NASAL | 2 refills | Status: DC

## 2023-07-26 MED ORDER — SERTRALINE HCL 100 MG PO TABS: 100.0000 mg | ORAL_TABLET | Freq: Every day | ORAL | 0 refills | Status: DC

## 2023-07-26 MED ORDER — OMEPRAZOLE 20 MG PO CPDR: 20.0000 mg | DELAYED_RELEASE_CAPSULE | Freq: Every day | ORAL | 0 refills | Status: DC

## 2023-07-26 NOTE — Telephone Encounter (Signed)
Refills sent as requested.  Patient is due for his CPE after September 08/26/23.  Please schedule with fasting labs prior.

## 2023-07-26 NOTE — Telephone Encounter (Signed)
Lvmtcb, sent mychart message  

## 2023-07-26 NOTE — Telephone Encounter (Signed)
Tawanna Cooler, Pharmacist from [facility] contacted the office in regards to Rx for patient. Caller says that [facility] is [facility] and [facility] is not cooperating with sending [facility] all of patient's RX. Caller says they are needing a new RX sent to them for medications omeprazole, sertraline, and flonase. Provided fax number [phone]. I have added pharmacy into patient's list of preferred pharmacies.  sertraline (ZOLOFT) 100 MG tablet  omeprazole (PRILOSEC) 20 MG capsule  fluticasone (FLONASE) 50 MCG/ACT nasal spray

## 2023-08-17 ENCOUNTER — Other Ambulatory Visit: Payer: Self-pay | Admitting: Family Medicine

## 2023-08-17 ENCOUNTER — Other Ambulatory Visit: Payer: Self-pay | Admitting: Internal Medicine

## 2023-08-17 DIAGNOSIS — K219 Gastro-esophageal reflux disease without esophagitis: Secondary | ICD-10-CM

## 2023-08-17 DIAGNOSIS — J309 Allergic rhinitis, unspecified: Secondary | ICD-10-CM

## 2023-08-17 DIAGNOSIS — F411 Generalized anxiety disorder: Secondary | ICD-10-CM

## 2023-08-17 DIAGNOSIS — E785 Hyperlipidemia, unspecified: Secondary | ICD-10-CM

## 2023-08-30 ENCOUNTER — Telehealth: Payer: Self-pay | Admitting: Family Medicine

## 2023-08-30 NOTE — Telephone Encounter (Addendum)
Patient has not been seen in over a year. Last CPE 08/25/2022.  Date of visit required on form 09/19/22 through 09/19/2023.  Can't complete form until patient is seen for his CPE on 10/03/2023 which will be past the deadline.  Will have front office call to try and move up his CPE before 09/19/2023.

## 2023-08-30 NOTE — Telephone Encounter (Signed)
Form completed and placed in Dr. Lillie Fragmin office in box to sign.  ?

## 2023-08-30 NOTE — Telephone Encounter (Signed)
Spoke to pt, he stated his insurance would accept last yr's appt of 08/25/22, despite what the form says.

## 2023-08-30 NOTE — Telephone Encounter (Signed)
Patient dropped off document  Physical form  , to be filled out by provider. Patient requested to send it back via Call Patient to pick up within ASAP. Document is located in providers tray at front office.Please advise at Mobile [phone] (mobile)

## 2023-08-31 NOTE — Telephone Encounter (Signed)
Dare notified by telephone that Erik [name]'s Health and Wellness Form is ready to be picked up at the front desk.

## 2023-09-09 ENCOUNTER — Telehealth: Payer: Self-pay | Admitting: *Deleted

## 2023-09-09 DIAGNOSIS — Z125 Encounter for screening for malignant neoplasm of prostate: Secondary | ICD-10-CM

## 2023-09-09 DIAGNOSIS — Z131 Encounter for screening for diabetes mellitus: Secondary | ICD-10-CM

## 2023-09-09 DIAGNOSIS — E785 Hyperlipidemia, unspecified: Secondary | ICD-10-CM

## 2023-09-09 DIAGNOSIS — Z79899 Other long term (current) drug therapy: Secondary | ICD-10-CM

## 2023-09-09 NOTE — Telephone Encounter (Signed)
-----   Message from Alvina Chou sent at 09/09/2023  9:08 AM EDT ----- Regarding: Lab orders for Mon, 10.7.24 Patient is scheduled for CPX labs, please order future labs, Thanks , Camelia Eng

## 2023-09-26 ENCOUNTER — Other Ambulatory Visit (INDEPENDENT_AMBULATORY_CARE_PROVIDER_SITE_OTHER): Payer: 59

## 2023-09-26 DIAGNOSIS — E785 Hyperlipidemia, unspecified: Secondary | ICD-10-CM | POA: Diagnosis not present

## 2023-09-26 DIAGNOSIS — Z125 Encounter for screening for malignant neoplasm of prostate: Secondary | ICD-10-CM | POA: Diagnosis not present

## 2023-09-26 DIAGNOSIS — Z131 Encounter for screening for diabetes mellitus: Secondary | ICD-10-CM | POA: Diagnosis not present

## 2023-09-26 DIAGNOSIS — Z79899 Other long term (current) drug therapy: Secondary | ICD-10-CM | POA: Diagnosis not present

## 2023-09-26 LAB — BASIC METABOLIC PANEL
BUN: 16 mg/dL (ref 6–23)
CO2: 30 meq/L (ref 19–32)
Calcium: 9.6 mg/dL (ref 8.4–10.5)
Chloride: 101 meq/L (ref 96–112)
Creatinine, Ser: 0.99 mg/dL (ref 0.40–1.50)
GFR: 82.23 mL/min (ref >60.00)
Glucose, Bld: 111 mg/dL — ABNORMAL HIGH (ref 70–99)
Potassium: 3.8 meq/L (ref 3.5–5.1)
Sodium: 141 meq/L (ref 135–145)

## 2023-09-26 LAB — CBC WITH DIFFERENTIAL/PLATELET
Basophils Absolute: 0 10*3/uL (ref 0.0–0.1)
Basophils Relative: 0.4 % (ref 0.0–3.0)
Eosinophils Absolute: 0.1 10*3/uL (ref 0.0–0.7)
Eosinophils Relative: 1.9 % (ref 0.0–5.0)
HCT: 43.5 % (ref 39.0–52.0)
Hemoglobin: 14.5 g/dL (ref 13.0–17.0)
Lymphocytes Relative: 34 % (ref 12.0–46.0)
Lymphs Abs: 2.6 10*3/uL (ref 0.7–4.0)
MCHC: 33.4 g/dL (ref 30.0–36.0)
MCV: 90.9 fL (ref 78.0–100.0)
Monocytes Absolute: 0.4 10*3/uL (ref 0.1–1.0)
Monocytes Relative: 5.6 % (ref 3.0–12.0)
Neutro Abs: 4.4 10*3/uL (ref 1.4–7.7)
Neutrophils Relative %: 58.1 % (ref 43.0–77.0)
Platelets: 251 10*3/uL (ref 150.0–400.0)
RBC: 4.79 Mil/uL (ref 4.22–5.81)
RDW: 12.9 % (ref 11.5–15.5)
WBC: 7.6 10*3/uL (ref 4.0–10.5)

## 2023-09-26 LAB — HEPATIC FUNCTION PANEL
ALT: 27 U/L (ref 0–53)
AST: 20 U/L (ref 0–37)
Albumin: 4.9 g/dL (ref 3.5–5.2)
Alkaline Phosphatase: 41 U/L (ref 39–117)
Bilirubin, Direct: 0.1 mg/dL (ref 0.0–0.3)
Total Bilirubin: 0.6 mg/dL (ref 0.2–1.2)
Total Protein: 7.1 g/dL (ref 6.0–8.3)

## 2023-09-26 LAB — LIPID PANEL
Cholesterol: 206 mg/dL — ABNORMAL HIGH (ref 0–200)
HDL: 54.3 mg/dL (ref >39.00)
LDL Cholesterol: 132 mg/dL — ABNORMAL HIGH (ref 0–99)
NonHDL: 151.25
Total CHOL/HDL Ratio: 4
Triglycerides: 94 mg/dL (ref 0.0–149.0)
VLDL: 18.8 mg/dL (ref 0.0–40.0)

## 2023-09-26 LAB — HEMOGLOBIN A1C: Hgb A1c MFr Bld: 5.9 % (ref 4.6–6.5)

## 2023-09-28 LAB — PSA, TOTAL WITH REFLEX TO PSA, FREE: PSA, Total: 1.3 ng/mL (ref <=4.0)

## 2023-10-01 NOTE — Progress Notes (Unsigned)
Erik Brideau T. Erik Ibanez, MD, CAQ Sports Medicine [facility] [address] [place], [address]  Phone: [phone]  FAX: [phone]  Erik [name] - 62 y.o. male  MRN [id number]  Date of Birth: Oct 25, 1961  Date: 10/03/2023  PCP: [name], MD  Referral: [name], MD  No chief complaint on file.  Patient Care Team: [name], MD as PCP - General Subjective:   Erik [name] is a 62 y.o. pleasant patient who presents with the following:  Preventative Health Maintenance Visit:  Health Maintenance Summary Reviewed and updated, unless pt declines services.  Tobacco History Reviewed. Alcohol: No concerns, no excessive use Exercise Habits: Some activity, rec at least 30 mins 5 times a week STD concerns: no risk or activity to increase risk Drug Use: None  Covid - never Shingrix Flu - never  HTN: Tolerating all medications without side effects Stable and at goal No CP, no sob. No HA.  BP Readings from Last 3 Encounters:  04/13/23 (!) 140/80  11/25/22 126/82  11/23/22 130/74    Basic Metabolic Panel:    Component Value Date/Time   NA 141 09/26/2023 0748   NA 135 (L) 04/17/2014 2220   K 3.8 09/26/2023 0748   K 4.1 04/17/2014 2220   CL 101 09/26/2023 0748   CL 102 04/17/2014 2220   CO2 30 09/26/2023 0748   CO2 30 04/17/2014 2220   BUN 16 09/26/2023 0748   BUN 17 04/17/2014 2220   CREATININE 0.99 09/26/2023 0748   CREATININE 0.94 04/17/2014 2220   GLUCOSE 111 (H) 09/26/2023 0748   GLUCOSE 122 (H) 04/17/2014 2220   CALCIUM 9.6 09/26/2023 0748   CALCIUM 9.2 04/17/2014 2220    Lipids: Doing well, stable. Tolerating meds fine with no SE. Panel reviewed with patient.  Lipids: Lab Results  Component Value Date   CHOL 206 (H) 09/26/2023   Lab Results  Component Value Date   HDL 54.30 09/26/2023   Lab Results  Component Value Date   LDLCALC 132 (H) 09/26/2023   Lab Results  Component Value Date    TRIG 94.0 09/26/2023   Lab Results  Component Value Date   CHOLHDL 4 09/26/2023    Lab Results  Component Value Date   ALT 27 09/26/2023   AST 20 09/26/2023   ALKPHOS 41 09/26/2023   BILITOT 0.6 09/26/2023     Health Maintenance  Topic Date Due   COVID-19 Vaccine (1) Never done   Zoster Vaccines- Shingrix (1 of 2) Never done   INFLUENZA VACCINE  Never done   DTaP/Tdap/Td (2 - Td or Tdap) 03/25/2024   Colonoscopy  11/23/2032   Hepatitis C Screening  Completed   HIV Screening  Completed   HPV VACCINES  Aged Out   Immunization History  Administered Date(s) Administered   Tdap 03/25/2014   Patient Active Problem List   Diagnosis Date Noted   TIA (transient ischemic attack) 11/17/2020    Priority: High   OSA on CPAP 09/14/2021    Priority: Medium    Essential hypertension 03/17/2021    Priority: Medium    Hyperlipidemia LDL goal <70 07/07/2009    Priority: Medium    Generalized anxiety disorder 06/04/2009    Priority: Medium    DEPRESSION 06/04/2009    Priority: Medium    ALLERGIC RHINITIS 06/04/2009    Priority: Low   GERD 06/04/2009    Priority: Low    Past Medical History:  Diagnosis Date  ALLERGIC RHINITIS 06/04/2009   Allergy    ANXIETY 06/04/2009   DEPRESSION 06/04/2009   GERD 06/04/2009   HYPERLIPIDEMIA 07/07/2009   Sleep apnea    uses cpap   TIA (transient ischemic attack) 11/17/2020    Past Surgical History:  Procedure Laterality Date   COLONOSCOPY  11/23/2022   10/09/12-normal   ORIF ELBOW FRACTURE  12/20/1986   right   SHOULDER SURGERY     UPPER GASTROINTESTINAL ENDOSCOPY  12/20/1997   dilatation for stricture    Family History  Problem Relation Age of Onset   Prostate cancer Maternal Grandfather    Stomach cancer Neg Hx    Esophageal cancer Neg Hx    Rectal cancer Neg Hx    Colon polyps Neg Hx    Colon cancer Neg Hx     Social History   Social History Narrative   Not on file    Past Medical History, Surgical History,  Social History, Family History, Problem List, Medications, and Allergies have been reviewed and updated if relevant.  Review of Systems: Pertinent positives are listed above.  Otherwise, a full 14 point review of systems has been done in full and it is negative except where it is noted positive.  Objective:   There were no vitals taken for this visit. Ideal Body Weight:    Ideal Body Weight:   No results found.    08/25/2022    9:17 AM 07/23/2019    9:06 AM 05/01/2018    8:08 AM  Depression screen PHQ 2/9  Decreased Interest 0 0 0  Down, Depressed, Hopeless 0 0 0  PHQ - 2 Score 0 0 0     GEN: well developed, well nourished, no acute distress Eyes: conjunctiva and lids normal, PERRLA, EOMI ENT: TM clear, nares clear, oral exam WNL Neck: supple, no lymphadenopathy, no thyromegaly, no JVD Pulm: clear to auscultation and percussion, respiratory effort normal CV: regular rate and rhythm, S1-S2, no murmur, rub or gallop, no bruits, peripheral pulses normal and symmetric, no cyanosis, clubbing, edema or varicosities GI: soft, non-tender; no hepatosplenomegaly, masses; active bowel sounds all quadrants GU: deferred Lymph: no cervical, axillary or inguinal adenopathy MSK: gait normal, muscle tone and strength WNL, no joint swelling, effusions, discoloration, crepitus  SKIN: clear, good turgor, color WNL, no rashes, lesions, or ulcerations Neuro: normal mental status, normal strength, sensation, and motion Psych: alert; oriented to person, place and time, normally interactive and not anxious or depressed in appearance.  All labs reviewed with patient. Results for orders placed or performed in visit on 09/26/23  PSA, Total with Reflex to PSA, Free  Result Value Ref Range   PSA, Total 1.3 < OR = 4.0 ng/mL  Hemoglobin A1c  Result Value Ref Range   Hgb A1c MFr Bld 5.9 4.6 - 6.5 %  Hepatic function panel  Result Value Ref Range   Total Bilirubin 0.6 0.2 - 1.2 mg/dL   Bilirubin, Direct 0.1  0.0 - 0.3 mg/dL   Alkaline Phosphatase 41 39 - 117 U/L   AST 20 0 - 37 U/L   ALT 27 0 - 53 U/L   Total Protein 7.1 6.0 - 8.3 g/dL   Albumin 4.9 3.5 - 5.2 g/dL  Basic metabolic panel  Result Value Ref Range   Sodium 141 135 - 145 mEq/L   Potassium 3.8 3.5 - 5.1 mEq/L   Chloride 101 96 - 112 mEq/L   CO2 30 19 - 32 mEq/L   Glucose, Bld 111 (H) 70 -  99 mg/dL   BUN 16 6 - 23 mg/dL   Creatinine, Ser 3.76 0.40 - 1.50 mg/dL   GFR 28.31 >51.76 mL/min   Calcium 9.6 8.4 - 10.5 mg/dL  CBC with Differential/Platelet  Result Value Ref Range   WBC 7.6 4.0 - 10.5 K/uL   RBC 4.79 4.22 - 5.81 Mil/uL   Hemoglobin 14.5 13.0 - 17.0 g/dL   HCT 16.0 73.7 - 10.6 %   MCV 90.9 78.0 - 100.0 fl   MCHC 33.4 30.0 - 36.0 g/dL   RDW 26.9 48.5 - 46.2 %   Platelets 251.0 150.0 - 400.0 K/uL   Neutrophils Relative % 58.1 43.0 - 77.0 %   Lymphocytes Relative 34.0 12.0 - 46.0 %   Monocytes Relative 5.6 3.0 - 12.0 %   Eosinophils Relative 1.9 0.0 - 5.0 %   Basophils Relative 0.4 0.0 - 3.0 %   Neutro Abs 4.4 1.4 - 7.7 K/uL   Lymphs Abs 2.6 0.7 - 4.0 K/uL   Monocytes Absolute 0.4 0.1 - 1.0 K/uL   Eosinophils Absolute 0.1 0.0 - 0.7 K/uL   Basophils Absolute 0.0 0.0 - 0.1 K/uL  Lipid panel  Result Value Ref Range   Cholesterol 206 (H) 0 - 200 mg/dL   Triglycerides 70.3 0.0 - 149.0 mg/dL   HDL 50.09 >38.18 mg/dL   VLDL 29.9 0.0 - 37.1 mg/dL   LDL Cholesterol 696 (H) 0 - 99 mg/dL   Total CHOL/HDL Ratio 4    NonHDL 151.25     Assessment and Plan:     ICD-10-CM   1. Healthcare maintenance  Z00.00       Health Maintenance Exam: The patient's preventative maintenance and recommended screening tests for an annual wellness exam were reviewed in full today. Brought up to date unless services declined.  Counselled on the importance of diet, exercise, and its role in overall health and mortality. The patient's FH and SH was reviewed, including their home life, tobacco status, and drug and alcohol  status.  Follow-up in 1 year for physical exam or additional follow-up below.  Disposition: No follow-ups on file.  No orders of the defined types were placed in this encounter.  There are no discontinued medications. No orders of the defined types were placed in this encounter.   Signed,  Elpidio Galea. Glendy Barsanti, MD   Allergies as of 10/03/2023   No Known Allergies      Medication List        Accurate as of October 01, 2023  8:07 AM. If you have any questions, ask your nurse or doctor.          fluticasone 50 MCG/ACT nasal spray Commonly known as: FLONASE TAKE TWO SPRAYS INTO BOTH NOSTRILS DAILY AS NEEDED   loratadine 10 MG tablet Commonly known as: CLARITIN Take 10 mg by mouth daily as needed.   losartan-hydrochlorothiazide 50-12.5 MG tablet Commonly known as: HYZAAR TAKE 1 TABLET BY MOUTH EVERY DAY   omeprazole 20 MG capsule Commonly known as: PRILOSEC TAKE 1 CAPSULE BY MOUTH EVERY DAY   sertraline 100 MG tablet Commonly known as: ZOLOFT TAKE 1 TABLET BY MOUTH EVERY DAY   simvastatin 40 MG tablet Commonly known as: ZOCOR TAKE 1 TABLET BY MOUTH EVERY DAY

## 2023-10-03 ENCOUNTER — Encounter: Payer: Self-pay | Admitting: Family Medicine

## 2023-10-03 ENCOUNTER — Ambulatory Visit (INDEPENDENT_AMBULATORY_CARE_PROVIDER_SITE_OTHER): Payer: 59 | Admitting: Family Medicine

## 2023-10-03 VITALS — BP 120/70 | HR 68 | Temp 99.1°F | Ht 70.25 in | Wt 227.1 lb

## 2023-10-03 DIAGNOSIS — Z0000 Encounter for general adult medical examination without abnormal findings: Secondary | ICD-10-CM | POA: Diagnosis not present

## 2023-10-03 DIAGNOSIS — R7303 Prediabetes: Secondary | ICD-10-CM | POA: Diagnosis not present

## 2023-10-23 ENCOUNTER — Other Ambulatory Visit: Payer: Self-pay | Admitting: Family Medicine

## 2023-10-23 DIAGNOSIS — F411 Generalized anxiety disorder: Secondary | ICD-10-CM

## 2023-10-23 DIAGNOSIS — E785 Hyperlipidemia, unspecified: Secondary | ICD-10-CM

## 2023-10-23 DIAGNOSIS — K219 Gastro-esophageal reflux disease without esophagitis: Secondary | ICD-10-CM

## 2023-11-28 ENCOUNTER — Other Ambulatory Visit: Payer: Self-pay | Admitting: Family Medicine

## 2023-11-28 DIAGNOSIS — K219 Gastro-esophageal reflux disease without esophagitis: Secondary | ICD-10-CM

## 2023-11-28 DIAGNOSIS — E785 Hyperlipidemia, unspecified: Secondary | ICD-10-CM

## 2023-11-28 DIAGNOSIS — F411 Generalized anxiety disorder: Secondary | ICD-10-CM

## 2023-11-28 MED ORDER — OMEPRAZOLE 20 MG PO CPDR: 20.0000 mg | DELAYED_RELEASE_CAPSULE | Freq: Every day | ORAL | 3 refills | Status: DC

## 2023-11-28 MED ORDER — LOSARTAN POTASSIUM-HCTZ 50-12.5 MG PO TABS: 1.0000 | ORAL_TABLET | Freq: Every day | ORAL | 3 refills | Status: DC

## 2023-11-28 MED ORDER — SERTRALINE HCL 100 MG PO TABS: 100.0000 mg | ORAL_TABLET | Freq: Every day | ORAL | 3 refills | Status: DC

## 2023-11-28 MED ORDER — SIMVASTATIN 40 MG PO TABS: 40.0000 mg | ORAL_TABLET | Freq: Every day | ORAL | 3 refills | Status: DC

## 2024-07-10 NOTE — Progress Notes (Signed)
 Cardiology Office Note:   Date:  07/16/2024  ID:  Erik [name], DOB 12/17/1961, MRN [id number] PCP:  Watt Mirza, MD  [facility] Providers Cardiologist:  Wendel Haws, MD Referring MD: Watt Mirza, MD  Chief Complaint/Reason for Referral: Follow-up for cardiomyopathy ASSESSMENT:    1. Cardiomyopathy, unspecified type (HCC)   2. Primary hypertension   3. Dyslipidemia   4. CKD (chronic kidney disease) stage 2, GFR 60-89 ml/min   5. BMI 33.0-33.9,adult     PLAN:   In order of problems listed above: Cardiomyopathy: EF remains mildly abnormal.  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   Hypertension: Continue increase losartan /hydrochlorothiazide  to 100/25 daily for target blood pressure of less than 130/80; stressed need for patient to keep track of blood pressure at home. Dyslipidemia: Patient does not have any indication for intensive lipid-lowering.  This is being followed by the patient's primary care provider.  Continue simvastatin  40 mg.  Check lipid panel, LFTs, and LP(a) today. CKD stage II: Increase losartan /hydrochlorothiazide  as above Elevated BMI: Hemoglobin A1c within the year was consistent with prediabetes.  Continue diet and exercise modification.            Dispo:  Return in about 1 year (around 07/16/2025).       Labs/tests ordered: Orders Placed This Encounter  Procedures   CT CORONARY MORPH W/CTA COR W/SCORE W/CA W/CM &/OR WO/CM   Lipoprotein A (LPA)   Lipid panel   Hepatic function panel    Current medicines are reviewed at length with the patient today.  The patient does not have concerns regarding medicines.  I spent 33 minutes reviewing all clinical data during and prior to this visit including all relevant imaging studies, laboratories, clinical information from other health systems and prior notes from both Cardiology and other specialties, interviewing the patient,  conducting a complete physical examination, and coordinating care in order to formulate a comprehensive and personalized evaluation and treatment plan.   History of Present Illness:    FOCUSED PROBLEM LIST:   Cardiomyopathy No valve issues, EF 50 to 55% TTE 2023 No valve issues, EF 50 to 55% TTE 2024 Hypertension Hyperlipidemia RBBB OSA  On CPAP BMI 33 CKD stage II/GFR 82 Prediabetes Truck driver  December 7976: The patient is a 63 y.o. male with the indicated medical history here for recommendations regarding incidentally noted trigeminy after colonoscopy.  The patient underwent a screening colonoscopy a few days ago.  When he was in recovery was noted that he had frequent ectopy and possibly trigeminy.  For this reason he was referred for cardiology consultation.  I reviewed the strips and they show trigeminy.   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  He denies any presyncope, syncope, palpitations, paroxysmal nocturnal dyspnea, exertional chest pain, or peripheral edema.  He has been using his CPAP religiously.   I did review his records and he had an episode several years ago of dizziness.  He was seen by neurology who did not think that he had a stroke or TIA.  Plan:TTE and monitor.   April 2024: Patient returns for routine follow-up.  In the interim he had an echocardiogram which showed a mildly reduced LV function and monitor which demonstrated frequent PACs and PVCs.  Today the patient tells me that he is quite well.  He denies any shortness of breath, chest pain, presyncope, syncope, or palpitations.  He is well  able to do everything that he needs to do in a day.  He has not required any emergency room visits or hospitalizations.  Plan: Increase losartan /hydrochlorothiazide  to 50/12.5 mg.  Obtain echocardiogram in 2 months.  July 2025:  Patient consents to use of AI scribe. In the interim the patient's echocardiogram demonstrated an EF of 50 to 55% which  was stable from prior.  He has not been monitoring his blood pressure at home, and his blood pressure is slightly elevated. No chest pain or shortness of breath. 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  He has a history of sleep apnea and has been using a CPAP machine for 30 years without any issues. No daytime sleepiness and states he cannot sleep without the CPAP.  He mentions a recent episode of back pain that kept him home from work for a week, but he plans to return to work tomorrow. The back pain is described as periodic and sometimes severe, requiring time to heal.  He is currently taking simvastatin  for cholesterol management and expresses some confusion regarding medication adjustments, but no changes have been made to his statin regimen.  No lightheadedness, blacking out, or swelling in his legs.     Current Medications: Current Meds  Medication Sig   fluticasone  (FLONASE ) 50 MCG/ACT nasal spray TAKE TWO SPRAYS INTO BOTH NOSTRILS DAILY AS NEEDED   loratadine (CLARITIN) 10 MG tablet Take 10 mg by mouth daily as needed.    losartan -hydrochlorothiazide  (HYZAAR) 100-25 MG tablet Take 1 tablet by mouth daily.   omeprazole  (PRILOSEC) 20 MG capsule Take 1 capsule (20 mg total) by mouth daily.   sertraline  (ZOLOFT ) 100 MG tablet Take 1 tablet (100 mg total) by mouth daily.   simvastatin  (ZOCOR ) 40 MG tablet Take 1 tablet (40 mg total) by mouth daily.   [DISCONTINUED] losartan -hydrochlorothiazide  (HYZAAR) 50-12.5 MG tablet Take 1 tablet by mouth daily.     Review of Systems:   Please see the history of present illness.    All other systems reviewed and are negative.     EKGs/Labs/Other Test Reviewed:   EKG: 2024 sinus rhythm with right bundle branch block  EKG Interpretation Date/Time:    Ventricular Rate:    PR Interval:    QRS Duration:    QT Interval:    QTC Calculation:   R Axis:      Text  Interpretation:           Risk Assessment/Calculations:          Physical Exam:   VS:  BP 138/76   Pulse 64   Resp (!) 96   Ht 5' 10 (1.778 m)   Wt 222 lb (100.7 kg)   BMI 31.85 kg/m        Wt Readings from Last 3 Encounters:  07/16/24 222 lb (100.7 kg)  10/03/23 227 lb 2 oz (103 kg)  04/13/23 232 lb (105.2 kg)      GENERAL:  No apparent distress, AOx3 HEENT:  No carotid bruits, +2 carotid impulses, no scleral icterus CAR: RRR no murmurs, gallops, rubs, or thrills RES:  Clear to auscultation bilaterally ABD:  Soft, nontender, nondistended, positive bowel sounds x 4 VASC:  +2 radial pulses, +2 carotid pulses NEURO:  CN 2-12 grossly intact; motor and sensory grossly intact PSYCH:  No active depression or anxiety EXT:  No edema, ecchymosis, or cyanosis  Signed, Earnest Mcgillis K Ramari Bray, MD  07/16/2024 8:39 AM    [facility] [address] [place], [place], [place]  [address] Phone: [phone]; Fax: [phone]   Note:  This document was prepared using Dragon voice recognition software and may include unintentional dictation errors.

## 2024-07-16 ENCOUNTER — Encounter: Payer: Self-pay | Admitting: Internal Medicine

## 2024-07-16 ENCOUNTER — Other Ambulatory Visit: Payer: Self-pay | Admitting: *Deleted

## 2024-07-16 ENCOUNTER — Ambulatory Visit: Payer: Self-pay | Attending: Internal Medicine | Admitting: Internal Medicine

## 2024-07-16 DIAGNOSIS — N182 Chronic kidney disease, stage 2 (mild): Secondary | ICD-10-CM

## 2024-07-16 DIAGNOSIS — Z6833 Body mass index (BMI) 33.0-33.9, adult: Secondary | ICD-10-CM

## 2024-07-16 DIAGNOSIS — E785 Hyperlipidemia, unspecified: Secondary | ICD-10-CM | POA: Diagnosis not present

## 2024-07-16 DIAGNOSIS — I10 Essential (primary) hypertension: Secondary | ICD-10-CM | POA: Diagnosis not present

## 2024-07-16 DIAGNOSIS — I429 Cardiomyopathy, unspecified: Secondary | ICD-10-CM | POA: Diagnosis not present

## 2024-07-16 MED ORDER — LOSARTAN POTASSIUM-HCTZ 100-25 MG PO TABS: 1.0000 | ORAL_TABLET | Freq: Every day | ORAL | 3 refills | Status: AC

## 2024-07-16 NOTE — Patient Instructions (Signed)
 Medication Instructions:  Your physician has recommended you make the following change in your medication:  1.) increase losartan  - hydrochlorothiazide  to 100/25 mg - one tablet daily  *If you need a refill on your cardiac medications before your next appointment, please call your pharmacy*  Lab Work: Today: go to first floor lab for blood work - lipids, liver, Lpa  If you have labs (blood work) drawn today and your tests are completely normal, you will receive your results only by: MyChart Message (if you have MyChart) OR A paper copy in the mail If you have any lab test that is abnormal or we need to change your treatment, we will call you to review the results.  Testing/Procedures: Coronary CT Angiogram - see instructions below  Follow-Up: At [facility], you and your health needs are our priority.  As part of our continuing mission to provide you with exceptional heart care, our providers are all part of one team.  This team includes your primary Cardiologist (physician) and Advanced Practice Providers or APPs (Physician Assistants and Nurse Practitioners) who all work together to provide you with the care you need, when you need it.  Your next appointment:   12 month(s)  Provider:   Glendia Ferrier, PA-C          Your cardiac CT will be scheduled at one of the below locations:   [facility] [address] [place], [place] [address] [phone]  OR   [facility] [address]  [place], [place] [address]  If scheduled at [facility], please arrive at the [facility] and Children's Entrance (Entrance C2) of [facility] 30 minutes prior to test start time. You can use the FREE valet parking offered at entrance C (encouraged to control the heart rate for the test)  Proceed to the [facility] Radiology Department (first floor) to check-in and test prep.  All radiology patients and guests should use entrance C2  at [facility], accessed from [facility], even though the hospital's physical address listed is [address].  If scheduled at the Heart and Vascular Tower at [address], please enter the parking lot using the [address] entrance and use the FREE valet service at the patient drop-off area. Enter the buidling and check-in with registration on the main floor.  Please follow these instructions carefully (unless otherwise directed):  An IV will be required for this test and Nitroglycerin will be given.  Hold all erectile dysfunction medications at least 3 days (72 hrs) prior to test. (Ie viagra, cialis, sildenafil, tadalafil, etc)   On the Night Before the Test: Be sure to Drink plenty of water. Do not consume any caffeinated/decaffeinated beverages or chocolate 12 hours prior to your test. Do not take any antihistamines 12 hours prior to your test.  On the Day of the Test: Drink plenty of water until 1 hour prior to the test. Do not eat any food 1 hour prior to test. You may take your regular medications prior to the test.  If you take Furosemide Hydrochlorothiazide  /Spironolactone/Chlorthalidone, please HOLD on the morning of the test. Patients who wear a continuous glucose monitor MUST remove the device prior to scanning.      After the Test: Drink plenty of water. After receiving IV contrast, you may experience a mild flushed feeling. This is normal. On occasion, you may experience a mild rash up to 24 hours after the test.  This is not dangerous. If this occurs, you can take Benadryl 25 mg, Zyrtec, Claritin, or Allegra and increase your fluid intake. (Patients taking Tikosyn should avoid Benadryl, and may take Zyrtec, Claritin, or Allegra) If you experience trouble breathing, this can be serious. If it is severe call 911 IMMEDIATELY. If it is mild, please call our office.  We will call to schedule your test 2-4 weeks out understanding that some  insurance companies will need an authorization prior to the service being performed.   For more information and frequently asked questions, please visit our website : [email or web address]  For non-scheduling related questions, please contact the cardiac imaging nurse navigator should you have any questions/concerns: Cardiac Imaging Nurse Navigators Direct Office Dial: [phone]   For scheduling needs, including cancellations and rescheduling, please call [place], [phone].

## 2024-07-17 ENCOUNTER — Ambulatory Visit: Payer: Self-pay | Admitting: Internal Medicine

## 2024-07-17 LAB — LIPID PANEL
Chol/HDL Ratio: 4.8 ratio (ref 0.0–5.0)
Cholesterol, Total: 229 mg/dL — ABNORMAL HIGH (ref 100–199)
HDL: 48 mg/dL (ref >39)
LDL Chol Calc (NIH): 157 mg/dL — ABNORMAL HIGH (ref 0–99)
Triglycerides: 134 mg/dL (ref 0–149)
VLDL Cholesterol Cal: 24 mg/dL (ref 5–40)

## 2024-07-17 LAB — HEPATIC FUNCTION PANEL
ALT: 35 IU/L (ref 0–44)
AST: 19 IU/L (ref 0–40)
Albumin: 5.1 g/dL — ABNORMAL HIGH (ref 3.9–4.9)
Alkaline Phosphatase: 47 IU/L (ref 44–121)
Bilirubin Total: 0.4 mg/dL (ref 0.0–1.2)
Bilirubin, Direct: 0.13 mg/dL (ref 0.00–0.40)
Total Protein: 7.6 g/dL (ref 6.0–8.5)

## 2024-07-17 LAB — LIPOPROTEIN A (LPA): Lipoprotein (a): 29.6 nmol/L (ref <75.0)

## 2024-08-06 ENCOUNTER — Ambulatory Visit (HOSPITAL_COMMUNITY)
Admission: RE | Admit: 2024-08-06 | Discharge: 2024-08-06 | Disposition: A | Discharge status: Home / Self Care | Source: Ambulatory Visit | Attending: Internal Medicine | Admitting: Internal Medicine

## 2024-08-06 DIAGNOSIS — I429 Cardiomyopathy, unspecified: Secondary | ICD-10-CM | POA: Diagnosis present

## 2024-08-06 DIAGNOSIS — I2510 Atherosclerotic heart disease of native coronary artery without angina pectoris: Secondary | ICD-10-CM | POA: Insufficient documentation

## 2024-08-06 MED ORDER — IOHEXOL 350 MG/ML SOLN
100.0000 mL | Freq: Once | INTRAVENOUS | Status: AC | PRN
  Administered 2024-08-06: 100 mL via INTRAVENOUS

## 2024-08-06 MED ORDER — NITROGLYCERIN 0.4 MG SL SUBL
0.8000 mg | SUBLINGUAL_TABLET | Freq: Once | SUBLINGUAL | Status: AC
  Administered 2024-08-06: 0.8 mg via SUBLINGUAL

## 2024-08-07 ENCOUNTER — Other Ambulatory Visit: Payer: Self-pay | Admitting: Cardiology

## 2024-08-07 ENCOUNTER — Ambulatory Visit (HOSPITAL_BASED_OUTPATIENT_CLINIC_OR_DEPARTMENT_OTHER)
Admission: RE | Admit: 2024-08-07 | Discharge: 2024-08-07 | Disposition: A | Discharge status: Home / Self Care | Source: Ambulatory Visit | Attending: Cardiology | Admitting: Cardiology

## 2024-08-07 ENCOUNTER — Ambulatory Visit: Payer: Self-pay | Admitting: Internal Medicine

## 2024-08-07 DIAGNOSIS — I429 Cardiomyopathy, unspecified: Secondary | ICD-10-CM | POA: Diagnosis not present

## 2024-08-07 DIAGNOSIS — I2510 Atherosclerotic heart disease of native coronary artery without angina pectoris: Secondary | ICD-10-CM | POA: Diagnosis not present

## 2024-08-07 DIAGNOSIS — R931 Abnormal findings on diagnostic imaging of heart and coronary circulation: Secondary | ICD-10-CM | POA: Diagnosis not present

## 2024-08-08 ENCOUNTER — Telehealth: Payer: Self-pay

## 2024-08-08 ENCOUNTER — Other Ambulatory Visit: Payer: Self-pay | Admitting: Family Medicine

## 2024-08-08 ENCOUNTER — Encounter: Payer: Self-pay | Admitting: Internal Medicine

## 2024-08-08 DIAGNOSIS — Z125 Encounter for screening for malignant neoplasm of prostate: Secondary | ICD-10-CM

## 2024-08-08 DIAGNOSIS — E785 Hyperlipidemia, unspecified: Secondary | ICD-10-CM

## 2024-08-08 DIAGNOSIS — Z79899 Other long term (current) drug therapy: Secondary | ICD-10-CM

## 2024-08-08 DIAGNOSIS — Z131 Encounter for screening for diabetes mellitus: Secondary | ICD-10-CM

## 2024-08-08 NOTE — Telephone Encounter (Signed)
 Please call and schedule fasting lab appointment prior to CPE on 10/03/2024.  Future orders in Epic.

## 2024-08-08 NOTE — Telephone Encounter (Signed)
 lvm for pt to call office to schedule app

## 2024-08-08 NOTE — Telephone Encounter (Signed)
 Copied from CRM #[id number]. Topic: Clinical - Request for Lab/Test Order >> Aug 08, 2024  1:26 PM Donna BRAVO wrote: Reason for CRM: patient has appt for 10/03/24  Patient would like labs ordered and drawn before appt

## 2024-08-09 ENCOUNTER — Other Ambulatory Visit: Payer: Self-pay | Admitting: Family Medicine

## 2024-08-09 MED ORDER — NITROGLYCERIN 0.4 MG SL SUBL: 0.4000 mg | SUBLINGUAL_TABLET | SUBLINGUAL | 3 refills | Status: AC | PRN

## 2024-08-09 MED ORDER — ASPIRIN 81 MG PO TBEC: 81.0000 mg | DELAYED_RELEASE_TABLET | Freq: Every day | ORAL | Status: AC

## 2024-08-09 NOTE — Progress Notes (Signed)
 error

## 2024-08-09 NOTE — Telephone Encounter (Signed)
 Pt called and schedule appt

## 2024-08-09 NOTE — Telephone Encounter (Signed)
 Attempted to reach patient by phone to discuss.  His Software engineer did not connect me and I was unable to leave a message.    Placed orders for the asa and ntg.  Med list includes simvastatin  40 mg, with labs in July and no recent change in statin that I can see.  Will route to Dr. Wendel for any further recommendations.

## 2024-08-10 NOTE — Telephone Encounter (Addendum)
 Clarified with Dr. Wendel patient is going to have repeat blood work with his PCP and will reach back out to Dr. Wendel once he has those results.

## 2024-09-30 ENCOUNTER — Encounter: Payer: Self-pay | Admitting: Family Medicine

## 2024-09-30 NOTE — Progress Notes (Signed)
 Erik [name] T. Erik Hoch, MD, CAQ Sports Medicine [facility] [address] [place], [address]  Phone: [phone]  FAX: [phone]  KINTE TRIM - 63 y.o. male  MRN [id number]  Date of Birth: 12-03-1961  Date: 10/03/2024  PCP: Watt Mirza, MD  Referral: Watt Mirza, MD  No chief complaint on file.  Patient Care Team: Watt Mirza, MD as PCP - General Subjective:   Erik [name] is a 63 y.o. pleasant patient who presents with the following:  Discussed the use of AI scribe software for clinical note transcription with the patient, who gave verbal consent to proceed.  History of Present Illness     Preventative Health Maintenance Visit:  Health Maintenance Summary Reviewed and updated, unless pt declines services.  Tobacco History Reviewed. Alcohol: No concerns, no excessive use Exercise Habits: Some activity, rec at least 30 mins 5 times a week STD concerns: no risk or activity to increase risk Drug Use: None  Prevnar 20 Shingrix Tdap Influenza COVID vaccine  Health Maintenance  Topic Date Due   COVID-19 Vaccine (1) Never done   Pneumococcal Vaccine: 50+ Years (1 of 2 - PCV) Never done   Zoster Vaccines- Shingrix (1 of 2) Never done   DTaP/Tdap/Td (2 - Td or Tdap) 03/25/2024   Influenza Vaccine  Never done   Colonoscopy  11/23/2032   Hepatitis C Screening  Completed   HIV Screening  Completed   Hepatitis B Vaccines 19-59 Average Risk  Aged Out   HPV VACCINES  Aged Out   Meningococcal B Vaccine  Aged Out   Immunization History  Administered Date(s) Administered   Tdap 03/25/2014   Patient Active Problem List   Diagnosis Date Noted   TIA (transient ischemic attack) 11/17/2020    Priority: High   OSA on CPAP 09/14/2021    Priority: Medium    Essential hypertension 03/17/2021    Priority: Medium    Hyperlipidemia LDL goal <70 07/07/2009    Priority: Medium    Generalized anxiety disorder 06/04/2009     Priority: Medium    DEPRESSION 06/04/2009    Priority: Medium    ALLERGIC RHINITIS 06/04/2009    Priority: Low   GERD 06/04/2009    Priority: Low   Prediabetes 10/03/2023    Past Medical History:  Diagnosis Date   ALLERGIC RHINITIS 06/04/2009   ANXIETY 06/04/2009   DEPRESSION 06/04/2009   GERD 06/04/2009   HYPERLIPIDEMIA 07/07/2009   Sleep apnea    uses cpap   TIA (transient ischemic attack) 11/17/2020    Past Surgical History:  Procedure Laterality Date   COLONOSCOPY  11/23/2022   10/09/12-normal   ORIF ELBOW FRACTURE  12/20/1986   right   SHOULDER SURGERY     UPPER GASTROINTESTINAL ENDOSCOPY  12/20/1997   dilatation for stricture    Family History  Problem Relation Age of Onset   Prostate cancer Maternal Grandfather    Stomach cancer Neg Hx    Esophageal cancer Neg Hx    Rectal cancer Neg Hx    Colon polyps Neg Hx    Colon cancer Neg Hx     Social History   Social History Narrative   Not on file    Past Medical History, Surgical History, Social History, Family History, Problem List, Medications, and Allergies have been reviewed and updated if relevant.  Review of Systems: Pertinent positives are listed above.  Otherwise, a full 14 point review of systems has been done in  full and it is negative except where it is noted positive.  Objective:   There were no vitals taken for this visit. Ideal Body Weight:    Ideal Body Weight:   No results found.    10/03/2023    8:14 AM 08/25/2022    9:17 AM 07/23/2019    9:06 AM 05/01/2018    8:08 AM  Depression screen PHQ 2/9  Decreased Interest 0 0 0 0  Down, Depressed, Hopeless 0 0 0 0  PHQ - 2 Score 0 0 0 0     GEN: well developed, well nourished, no acute distress Eyes: conjunctiva and lids normal, PERRLA, EOMI ENT: TM clear, nares clear, oral exam WNL Neck: supple, no lymphadenopathy, no thyromegaly, no JVD Pulm: clear to auscultation and percussion, respiratory effort normal CV: regular rate and  rhythm, S1-S2, no murmur, rub or gallop, no bruits, peripheral pulses normal and symmetric, no cyanosis, clubbing, edema or varicosities GI: soft, non-tender; no hepatosplenomegaly, masses; active bowel sounds all quadrants GU: deferred Lymph: no cervical, axillary or inguinal adenopathy MSK: gait normal, muscle tone and strength WNL, no joint swelling, effusions, discoloration, crepitus  SKIN: clear, good turgor, color WNL, no rashes, lesions, or ulcerations Neuro: normal mental status, normal strength, sensation, and motion Psych: alert; oriented to person, place and time, normally interactive and not anxious or depressed in appearance.  All labs reviewed with patient. Results for orders placed or performed in visit on 07/16/24  Hepatic function panel   Collection Time: 07/16/24  8:55 AM  Result Value Ref Range   Total Protein 7.6 6.0 - 8.5 g/dL   Albumin 5.1 (H) 3.9 - 4.9 g/dL   Bilirubin Total 0.4 0.0 - 1.2 mg/dL   Bilirubin, Direct 9.86 0.00 - 0.40 mg/dL   Alkaline Phosphatase 47 44 - 121 IU/L   AST 19 0 - 40 IU/L   ALT 35 0 - 44 IU/L  Lipid panel   Collection Time: 07/16/24  8:55 AM  Result Value Ref Range   Cholesterol, Total 229 (H) 100 - 199 mg/dL   Triglycerides 865 0 - 149 mg/dL   HDL 48 >60 mg/dL   VLDL Cholesterol Cal 24 5 - 40 mg/dL   LDL Chol Calc (NIH) 842 (H) 0 - 99 mg/dL   Chol/HDL Ratio 4.8 0.0 - 5.0 ratio  Lipoprotein A (LPA)   Collection Time: 07/16/24  8:55 AM  Result Value Ref Range   Lipoprotein (a) 29.6 <75.0 nmol/L    Assessment and Plan:     ICD-10-CM   1. Healthcare maintenance  Z00.00      Assessment & Plan   Health Maintenance Exam: The patient's preventative maintenance and recommended screening tests for an annual wellness exam were reviewed in full today. Brought up to date unless services declined.  Counselled on the importance of diet, exercise, and its role in overall health and mortality. The patient's FH and SH was reviewed,  including their home life, tobacco status, and drug and alcohol status.  Follow-up in 1 year for physical exam or additional follow-up below.  Disposition: No follow-ups on file.  No orders of the defined types were placed in this encounter.  There are no discontinued medications. No orders of the defined types were placed in this encounter.   Signed,  Jacques DASEN. Lyana Asbill, MD   Allergies as of 10/03/2024   No Known Allergies      Medication List        Accurate as of September 30, 2024  9:27 AM. If you have any questions, ask your nurse or doctor.          aspirin  EC 81 MG tablet Take 1 tablet (81 mg total) by mouth daily. Swallow whole.   fluticasone  50 MCG/ACT nasal spray Commonly known as: FLONASE  TAKE TWO SPRAYS INTO BOTH NOSTRILS DAILY AS NEEDED   loratadine 10 MG tablet Commonly known as: CLARITIN Take 10 mg by mouth daily as needed.   losartan -hydrochlorothiazide  100-25 MG tablet Commonly known as: Hyzaar Take 1 tablet by mouth daily.   nitroGLYCERIN  0.4 MG SL tablet Commonly known as: NITROSTAT  Place 1 tablet (0.4 mg total) under the tongue every 5 (five) minutes as needed for chest pain.   omeprazole  20 MG capsule Commonly known as: PRILOSEC Take 1 capsule (20 mg total) by mouth daily.   sertraline  100 MG tablet Commonly known as: ZOLOFT  Take 1 tablet (100 mg total) by mouth daily.   simvastatin  40 MG tablet Commonly known as: ZOCOR  Take 1 tablet (40 mg total) by mouth daily.

## 2024-10-01 ENCOUNTER — Other Ambulatory Visit (INDEPENDENT_AMBULATORY_CARE_PROVIDER_SITE_OTHER): Payer: Self-pay

## 2024-10-01 DIAGNOSIS — Z131 Encounter for screening for diabetes mellitus: Secondary | ICD-10-CM | POA: Diagnosis not present

## 2024-10-01 DIAGNOSIS — Z79899 Other long term (current) drug therapy: Secondary | ICD-10-CM

## 2024-10-01 DIAGNOSIS — E785 Hyperlipidemia, unspecified: Secondary | ICD-10-CM | POA: Diagnosis not present

## 2024-10-01 DIAGNOSIS — Z125 Encounter for screening for malignant neoplasm of prostate: Secondary | ICD-10-CM | POA: Diagnosis not present

## 2024-10-01 LAB — BASIC METABOLIC PANEL WITH GFR
BUN: 14 mg/dL (ref 6–23)
CO2: 32 meq/L (ref 19–32)
Calcium: 9.5 mg/dL (ref 8.4–10.5)
Chloride: 99 meq/L (ref 96–112)
Creatinine, Ser: 1.03 mg/dL (ref 0.40–1.50)
GFR: 77.86 mL/min (ref >60.00)
Glucose, Bld: 116 mg/dL — ABNORMAL HIGH (ref 70–99)
Potassium: 3.6 meq/L (ref 3.5–5.1)
Sodium: 140 meq/L (ref 135–145)

## 2024-10-01 LAB — LIPID PANEL
Cholesterol: 178 mg/dL (ref 0–200)
HDL: 44.8 mg/dL (ref >39.00)
LDL Cholesterol: 118 mg/dL — ABNORMAL HIGH (ref 0–99)
NonHDL: 133.14
Total CHOL/HDL Ratio: 4
Triglycerides: 74 mg/dL (ref 0.0–149.0)
VLDL: 14.8 mg/dL (ref 0.0–40.0)

## 2024-10-01 LAB — CBC WITH DIFFERENTIAL/PLATELET
Basophils Absolute: 0 K/uL (ref 0.0–0.1)
Basophils Relative: 0.5 % (ref 0.0–3.0)
Eosinophils Absolute: 0.1 K/uL (ref 0.0–0.7)
Eosinophils Relative: 2.6 % (ref 0.0–5.0)
HCT: 42.5 % (ref 39.0–52.0)
Hemoglobin: 14.4 g/dL (ref 13.0–17.0)
Lymphocytes Relative: 37.1 % (ref 12.0–46.0)
Lymphs Abs: 1.8 K/uL (ref 0.7–4.0)
MCHC: 33.8 g/dL (ref 30.0–36.0)
MCV: 89.2 fl (ref 78.0–100.0)
Monocytes Absolute: 0.3 K/uL (ref 0.1–1.0)
Monocytes Relative: 5.7 % (ref 3.0–12.0)
Neutro Abs: 2.7 K/uL (ref 1.4–7.7)
Neutrophils Relative %: 54.1 % (ref 43.0–77.0)
Platelets: 231 K/uL (ref 150.0–400.0)
RBC: 4.76 Mil/uL (ref 4.22–5.81)
RDW: 12.7 % (ref 11.5–15.5)
WBC: 5 K/uL (ref 4.0–10.5)

## 2024-10-01 LAB — HEPATIC FUNCTION PANEL
ALT: 24 U/L (ref 0–53)
AST: 21 U/L (ref 0–37)
Albumin: 5.1 g/dL (ref 3.5–5.2)
Alkaline Phosphatase: 34 U/L — ABNORMAL LOW (ref 39–117)
Bilirubin, Direct: 0.1 mg/dL (ref 0.0–0.3)
Total Bilirubin: 0.6 mg/dL (ref 0.2–1.2)
Total Protein: 7.4 g/dL (ref 6.0–8.3)

## 2024-10-01 LAB — HEMOGLOBIN A1C: Hgb A1c MFr Bld: 5.9 % (ref 4.6–6.5)

## 2024-10-03 ENCOUNTER — Encounter: Payer: Self-pay | Admitting: Family Medicine

## 2024-10-03 ENCOUNTER — Ambulatory Visit: Payer: Self-pay | Admitting: Family Medicine

## 2024-10-03 ENCOUNTER — Ambulatory Visit (INDEPENDENT_AMBULATORY_CARE_PROVIDER_SITE_OTHER): Admitting: Family Medicine

## 2024-10-03 VITALS — BP 110/70 | HR 56 | Temp 98.4°F | Ht 69.5 in | Wt 199.2 lb

## 2024-10-03 DIAGNOSIS — Z0000 Encounter for general adult medical examination without abnormal findings: Secondary | ICD-10-CM

## 2024-10-03 DIAGNOSIS — I2510 Atherosclerotic heart disease of native coronary artery without angina pectoris: Secondary | ICD-10-CM

## 2024-10-03 DIAGNOSIS — Z23 Encounter for immunization: Secondary | ICD-10-CM

## 2024-10-03 DIAGNOSIS — J309 Allergic rhinitis, unspecified: Secondary | ICD-10-CM

## 2024-10-03 LAB — PSA, TOTAL WITH REFLEX TO PSA, FREE: PSA, Total: 1.2 ng/mL (ref <=4.0)

## 2024-10-03 MED ORDER — ROSUVASTATIN CALCIUM 40 MG PO TABS: 40.0000 mg | ORAL_TABLET | Freq: Every day | ORAL | 3 refills | Status: AC

## 2024-10-03 MED ORDER — FLUTICASONE PROPIONATE 50 MCG/ACT NA SUSP: NASAL | 3 refills | Status: AC

## 2024-10-04 ENCOUNTER — Other Ambulatory Visit: Payer: Self-pay | Admitting: Family Medicine

## 2024-10-04 DIAGNOSIS — F411 Generalized anxiety disorder: Secondary | ICD-10-CM

## 2024-10-04 DIAGNOSIS — K219 Gastro-esophageal reflux disease without esophagitis: Secondary | ICD-10-CM
